# Patient Record
Sex: Male | Born: 1965 | Race: White | Hispanic: No | Marital: Married | Smoking: Never smoker
Health system: Southern US, Community
[De-identification: ages and names within clinical notes are randomized; demographics above are authoritative.]

## PROBLEM LIST (undated history)

## (undated) DIAGNOSIS — B029 Zoster without complications: Secondary | ICD-10-CM

## (undated) HISTORY — DX: Zoster without complications: B02.9

---

## 1989-08-11 HISTORY — PX: KNEE ARTHROSCOPY: SHX127

## 1993-08-11 DIAGNOSIS — B029 Zoster without complications: Secondary | ICD-10-CM

## 1993-08-11 HISTORY — DX: Zoster without complications: B02.9

## 2003-08-12 HISTORY — PX: CYST EXCISION: SHX5701

## 2012-02-01 ENCOUNTER — Encounter (HOSPITAL_COMMUNITY): Payer: Self-pay | Admitting: Emergency Medicine

## 2012-02-01 ENCOUNTER — Emergency Department (HOSPITAL_COMMUNITY)
Admission: EM | Admit: 2012-02-01 | Discharge: 2012-02-01 | Disposition: A | Discharge status: Home / Self Care | Payer: BC Managed Care – PPO | Attending: Emergency Medicine | Admitting: Emergency Medicine

## 2012-02-01 DIAGNOSIS — S61019A Laceration without foreign body of unspecified thumb without damage to nail, initial encounter: Secondary | ICD-10-CM

## 2012-02-01 DIAGNOSIS — W261XXA Contact with sword or dagger, initial encounter: Secondary | ICD-10-CM | POA: Insufficient documentation

## 2012-02-01 DIAGNOSIS — S61209A Unspecified open wound of unspecified finger without damage to nail, initial encounter: Secondary | ICD-10-CM | POA: Insufficient documentation

## 2012-02-01 DIAGNOSIS — W260XXA Contact with knife, initial encounter: Secondary | ICD-10-CM | POA: Insufficient documentation

## 2012-02-01 MED ORDER — CEPHALEXIN 250 MG PO CAPS
500.0000 mg | ORAL_CAPSULE | Freq: Once | ORAL | Status: AC
  Administered 2012-02-01: 500 mg via ORAL
  Filled 2012-02-01: qty 2

## 2012-02-01 MED ORDER — IBUPROFEN 800 MG PO TABS: 800.0000 mg | ORAL_TABLET | Freq: Three times a day (TID) | ORAL | Status: AC | PRN

## 2012-02-01 MED ORDER — TETANUS-DIPHTH-ACELL PERTUSSIS 5-2.5-18.5 LF-MCG/0.5 IM SUSP
0.5000 mL | Freq: Once | INTRAMUSCULAR | Status: AC
  Administered 2012-02-01: 0.5 mL via INTRAMUSCULAR
  Filled 2012-02-01: qty 0.5

## 2012-02-01 MED ORDER — CEPHALEXIN 500 MG PO CAPS: 500.0000 mg | ORAL_CAPSULE | Freq: Four times a day (QID) | ORAL | Status: AC

## 2012-02-01 NOTE — ED Provider Notes (Signed)
History     CSN: [id number]  Arrival date & time 02/01/12  1927   First MD Initiated Contact with Patient 02/01/12 1945      Chief Complaint  Patient presents with  . Extremity Laceration    (Consider location/radiation/quality/duration/timing/severity/associated sxs/prior treatment) HPI Comments: Patient presents with a 2cm laceration to his left distal thumb. He cut his thumb with a clean kitchen knife this evening and immediately applied pressure after the bleeding started. The patient has full range of motion of his thumb and is in no pain. The patient has no neuro deficits. There were no foreign bodies present in the laceration and the cut was 2 cm deep. The patient does not know the date of his last tetanus shot.  Patient is a 46 y.o. male presenting with skin laceration. The history is provided by the patient.  Laceration  The incident occurred 3 to 5 hours ago. The laceration is located on the left hand. The laceration is 2 cm in size. The laceration mechanism was a a clean knife. The patient is experiencing no pain. He reports no foreign bodies present. His tetanus status is unknown.    History reviewed. No pertinent past medical history.  History reviewed. No pertinent past surgical history.  No family history on file.  History  Substance Use Topics  . Smoking status: Never Smoker   . Smokeless tobacco: Not on file  . Alcohol Use: No      Review of Systems  Constitutional: Negative for activity change.  Musculoskeletal: Negative for joint swelling and arthralgias.  Skin: Positive for wound.  Neurological: Negative for weakness and numbness.    Allergies  Review of patient's allergies indicates no known allergies.  Home Medications  No current outpatient prescriptions on file.  BP 160/96  Temp 98 F (36.7 C) (Oral)  Resp 18  SpO2 96%  Physical Exam  Nursing note and vitals reviewed. Constitutional: He appears well-developed and well-nourished.    HENT:  Head: Normocephalic and atraumatic.  Eyes: Conjunctivae are normal.  Neck: Normal range of motion. Neck supple.  Cardiovascular: Normal pulses.   Pulses:      Radial pulses are 2+ on the right side, and 2+ on the left side.  Musculoskeletal: He exhibits tenderness. He exhibits no edema.       Hands: Neurological: He is alert. No sensory deficit.       Motor, sensation, and vascular distal to the injury is fully intact.   Skin: Skin is warm and dry.  Psychiatric: He has a normal mood and affect.    ED Course  Procedures (including critical care time)  Labs Reviewed - No data to display No results found.   1. Thumb laceration     Patient seen and examined.  Vital signs reviewed and are as follows: Filed Vitals:   02/01/12 1930  BP: 160/96  Temp: 98 F (36.7 C)  Resp: 18   Digital block performed Technique: ring block  Finger: L thumb Area prepped with alcohol wipe and iodine Medication: 5mL of 2% lidocaine without epinephrine Patient tolerated procedure well. Moderate anesthesia achieved. Patient needed direct injection of <42mL lidocaine around wound to complete anesthesia.   LACERATION REPAIR Performed by: Carolee Rota Authorized by: Carolee Rota Consent: Verbal consent obtained. Risks and benefits: risks, benefits and alternatives were discussed Consent given by: patient Patient identity confirmed: provided demographic data Prepped and Draped in normal sterile fashion Wound explored  Laceration Location: L thumb distal to IP joint, ulnar aspect  Laceration Length: 2cm  No Foreign Bodies seen or palpated  Anesthesia: ring block as above  Irrigation method: syringe with splash guard  Amount of cleaning: copious  Skin closure: Ethilon 4-0  Number of sutures: 3  Technique: simple interrupted  Patient tolerance: Patient tolerated the procedure well with no immediate complications.  Keflex and tetanus booster given prior to discharge.    The patient was urged to return to the Emergency Department urgently with worsening pain, swelling, expanding erythema especially if it streaks away from the affected area, fever, or if they have any other concerns. Patient verbalized understanding. Urged ortho hand follow-up if he wishes to have specialist follow-up.   Urged return in 10 days for wound recheck and suture removal.   Patient verbalizes understanding and agrees with plan.    MDM  Do not suspect FB or bony involvement -- abx rx given proximity of laceration to bone after close examination. Fingertip has good vascular supply. No neuro deficit.         Renne Crigler, [place] 02/05/12 [phone]

## 2012-02-01 NOTE — ED Notes (Signed)
Pt has approx 2 cm laceration to L thumb and nail just pta while cooking dinner.  Unknown last DT. Still bleeding on arrival.

## 2012-02-01 NOTE — Discharge Instructions (Signed)
Please read and follow all provided instructions.  Your diagnoses today include:  1. Thumb laceration     Tests performed today include:  Vital signs. See below for your results today.   Medications prescribed:   Ibuprofen - anti-inflammatory pain medication  Do not exceed 800mg  ibuprofen every 8 hours  You have been prescribed an anti-inflammatory medication or NSAID. Take with food. Take smallest effective dose for the shortest duration needed for your pain. Stop taking if you experience stomach pain or vomiting.    Keflex - antibiotic that kills skin bacteria  You have been prescribed an antibiotic medicine: take the entire course of medicine even if you are feeling better. Stopping early can cause the antibiotic not to work.  Take any prescribed medications only as directed.  Home care instructions:   Follow any educational materials contained in this packet.  Wash wound gently with warm soap and water at least twice a day.  Follow-up instructions: Please follow-up with the orthopedic hand physician referral listed in 7-10 days for further evaluation of your symptoms. If you do not have a primary care doctor -- see below for referral information.   Return instructions:   Please return to the Emergency Department if you experience worsening symptoms.   Return with worsening pain, swelling, redness of the finger or if you have trouble moving finger  Please return if you have any other emergent concerns.  Additional Information:  Your vital signs today were: BP 160/96  Temp 98 F (36.7 C) (Oral)  Resp 18  SpO2 96% If your blood pressure (BP) was elevated above 135/85 this visit, please have this repeated by your doctor within one month. -------------- No Primary Care Doctor Call [facility]  [phone] Other agencies that provide inexpensive medical care    [facility]  [phone]    [facility]  [phone]    [facility]  [phone]    [facility]  [phone]    [facility]  [phone]    [facility]  [phone] -------------- RESOURCE GUIDE:  Dental Problems  Patients with Medicaid: [facility] [phone]                                            [phone] [address] Phone:  [phone]                                                   Phone:  [phone]  If unable to pay or uninsured, contact:  [facility] or [facility]. to become qualified for the adult dental clinic.  Chronic Pain Problems Contact [facility]  [phone] Patients need to be referred by their primary care doctor.  Insufficient Money for Medicine Contact [employer] Way:  call "211" or [facility] [phone].  Psychological Services [facility]  [phone] [facility]  [phone] [facility] Mental [facility]   [phone] (emergency services [phone])  Substance Abuse Resources [facility]  [phone] [facility] [phone] [facility] [place] [phone]  [facility] [phone] Residential & Outpatient Substance Abuse Program  [phone]  Abuse/Neglect [facility] Child Abuse Hotline 432-724-3043 [facility] Child Abuse Hotline [phone]) 080-5075 (After Hours)  Emergency Shelter [facility] [phone]  Maternity Homes [facility] [place] [facility] [place] [phone] [facility] [phone]  [facility]  [facility] [place]     [employer]                          [facility] Dept. [address] [place]                       [address]      [address]  Blondell Reveal Phone:  [phone]                                   Phone:  [phone]                 Phone:   [phone]  [facility] Phone:  [phone]  [facility] Child Abuse Hotline [phone] (After Hours)

## 2012-02-06 NOTE — ED Provider Notes (Signed)
Medical screening examination/treatment/procedure(s) were performed by non-physician practitioner and as supervising physician I was immediately available for consultation/collaboration.   Joya Gaskins, MD 02/06/12 [phone]

## 2016-03-25 ENCOUNTER — Ambulatory Visit: Payer: BC Managed Care – PPO | Admitting: Family Medicine

## 2016-03-28 ENCOUNTER — Encounter: Payer: Self-pay | Admitting: Family Medicine

## 2016-03-28 ENCOUNTER — Ambulatory Visit (INDEPENDENT_AMBULATORY_CARE_PROVIDER_SITE_OTHER): Payer: BC Managed Care – PPO | Admitting: Family Medicine

## 2016-03-28 VITALS — BP 112/64 | HR 79 | Temp 97.9°F | Ht 78.25 in | Wt 324.1 lb

## 2016-03-28 DIAGNOSIS — Z8349 Family history of other endocrine, nutritional and metabolic diseases: Secondary | ICD-10-CM

## 2016-03-28 DIAGNOSIS — R2241 Localized swelling, mass and lump, right lower limb: Secondary | ICD-10-CM

## 2016-03-28 DIAGNOSIS — Z0001 Encounter for general adult medical examination with abnormal findings: Secondary | ICD-10-CM

## 2016-03-28 DIAGNOSIS — Z0000 Encounter for general adult medical examination without abnormal findings: Secondary | ICD-10-CM

## 2016-03-28 LAB — BASIC METABOLIC PANEL
BUN: 14 mg/dL (ref 6–23)
CHLORIDE: 103 meq/L (ref 96–112)
CO2: 30 meq/L (ref 19–32)
CREATININE: 1.11 mg/dL (ref 0.40–1.50)
Calcium: 9.8 mg/dL (ref 8.4–10.5)
GFR: 74.95 mL/min (ref >60.00)
Glucose, Bld: 77 mg/dL (ref 70–99)
Potassium: 4.7 mEq/L (ref 3.5–5.1)
Sodium: 141 mEq/L (ref 135–145)

## 2016-03-28 LAB — CBC WITH DIFFERENTIAL/PLATELET
BASOS ABS: 0 10*3/uL (ref 0.0–0.1)
Basophils Relative: 0.6 % (ref 0.0–3.0)
Eosinophils Absolute: 0.1 10*3/uL (ref 0.0–0.7)
Eosinophils Relative: 1.7 % (ref 0.0–5.0)
HEMATOCRIT: 44.8 % (ref 39.0–52.0)
Hemoglobin: 15.4 g/dL (ref 13.0–17.0)
LYMPHS PCT: 24.3 % (ref 12.0–46.0)
Lymphs Abs: 1.8 10*3/uL (ref 0.7–4.0)
MCHC: 34.3 g/dL (ref 30.0–36.0)
MCV: 87.5 fl (ref 78.0–100.0)
MONOS PCT: 10 % (ref 3.0–12.0)
Monocytes Absolute: 0.8 10*3/uL (ref 0.1–1.0)
Neutro Abs: 4.8 10*3/uL (ref 1.4–7.7)
Neutrophils Relative %: 63.4 % (ref 43.0–77.0)
Platelets: 261 10*3/uL (ref 150.0–400.0)
RBC: 5.12 Mil/uL (ref 4.22–5.81)
RDW: 13.4 % (ref 11.5–15.5)
WBC: 7.5 10*3/uL (ref 4.0–10.5)

## 2016-03-28 LAB — LDL CHOLESTEROL, DIRECT: Direct LDL: 133 mg/dL

## 2016-03-28 LAB — LIPID PANEL
CHOL/HDL RATIO: 5
CHOLESTEROL: 203 mg/dL — AB (ref 0–200)
HDL: 37.9 mg/dL — ABNORMAL LOW (ref >39.00)
NONHDL: 165.13
Triglycerides: 273 mg/dL — ABNORMAL HIGH (ref 0.0–149.0)
VLDL: 54.6 mg/dL — ABNORMAL HIGH (ref 0.0–40.0)

## 2016-03-28 LAB — HEPATIC FUNCTION PANEL
ALK PHOS: 71 U/L (ref 39–117)
ALT: 34 U/L (ref 0–53)
AST: 20 U/L (ref 0–37)
Albumin: 4.2 g/dL (ref 3.5–5.2)
BILIRUBIN DIRECT: 0.1 mg/dL (ref 0.0–0.3)
TOTAL PROTEIN: 6.4 g/dL (ref 6.0–8.3)
Total Bilirubin: 0.5 mg/dL (ref 0.2–1.2)

## 2016-03-28 LAB — TSH: TSH: 2.12 u[IU]/mL (ref 0.35–4.50)

## 2016-03-28 LAB — PSA: PSA: 0.7 ng/mL (ref 0.10–4.00)

## 2016-03-28 NOTE — Patient Instructions (Signed)
We will call you with labs done today We will set up general surgery referral for right thigh lesion Try to lose some weight

## 2016-03-28 NOTE — Progress Notes (Signed)
Pre visit review using our clinic review tool, if applicable. No additional management support is needed unless otherwise documented below in the visit note. 

## 2016-03-28 NOTE — Progress Notes (Signed)
Subjective:     Patient ID: Tyler Fernandez, male   DOB: 1966/04/10, 50 y.o.   MRN: [id number]  HPI Patient is seen to establish care and requesting complete physical. He has unremarkable past medical history. Previously followed at [facility]. Last physical was February 2016. Never smoked. Rare alcohol use. No prior surgeries. Takes no medications.  Married and works as a Hydrologistconstruction instructor at [employer] Family history significant for father with history of hemachromatosis. His father had prostate cancer and died of another type of cancer which he thinks was pancreatic. Brother died age 49 possibly of pulmonary embolus following prolonged travel. No family history of colon cancer.  Patient has right posterior thigh lesion which is subcutaneous and has been present for several years but he thinks recently growing. Slightly sore to touch. Very firm to palpation. Denies any appetite or weight changes. No regional adenopathy. Prior history of cyst excision on the back which apparently was a sebaceous cyst. His wife states that current right thigh lesion is very similar in consistency. Denies prior history of thigh trauma  He is considering vasectomy. Would like names of local urologists. Last tetanus 2013  Past Medical History:  Diagnosis Date  . Shingles 1995   Past Surgical History:  Procedure Laterality Date  . CYST EXCISION  2005   benign lesion from back  . KNEE ARTHROSCOPY Right 1991    reports that he has never smoked. He does not have any smokeless tobacco history on file. He reports that he does not drink alcohol or use drugs. family history includes Other in his father; Prostate cancer in his father. Allergies  Allergen Reactions  . Aluminum-Containing Compounds Rash     Review of Systems  Constitutional: Negative for activity change, appetite change, fatigue and fever.  HENT: Negative for congestion, ear pain and trouble swallowing.   Eyes: Negative  for pain and visual disturbance.  Respiratory: Negative for cough, shortness of breath and wheezing.   Cardiovascular: Negative for chest pain and palpitations.  Gastrointestinal: Negative for abdominal distention, abdominal pain, blood in stool, constipation, diarrhea, nausea, rectal pain and vomiting.  Genitourinary: Negative for dysuria, hematuria and testicular pain.  Musculoskeletal: Negative for arthralgias and joint swelling.  Skin: Negative for rash.  Neurological: Negative for dizziness, syncope and headaches.  Hematological: Negative for adenopathy.  Psychiatric/Behavioral: Negative for confusion and dysphoric mood.       Objective:   Physical Exam  Constitutional: He is oriented to person, place, and time. He appears well-developed and well-nourished. No distress.  HENT:  Head: Normocephalic and atraumatic.  Right Ear: External ear normal.  Left Ear: External ear normal.  Mouth/Throat: Oropharynx is clear and moist.  Eyes: Conjunctivae and EOM are normal. Pupils are equal, round, and reactive to light.  Neck: Normal range of motion. Neck supple. No thyromegaly present.  Cardiovascular: Normal rate, regular rhythm and normal heart sounds.   No murmur heard. Pulmonary/Chest: No respiratory distress. He has no wheezes. He has no rales.  Abdominal: Soft. Bowel sounds are normal. He exhibits no distension and no mass. There is no tenderness. There is no rebound and no guarding.  Musculoskeletal: He exhibits no edema.  Right posterior thigh approximately 3 cm firm minimally mobile nontender hard mass  Lymphadenopathy:    He has no cervical adenopathy.  Neurological: He is alert and oriented to person, place, and time. He displays normal reflexes. No cranial nerve deficit.  Skin: No rash noted.  Psychiatric: He has a normal mood  and affect.       Assessment:     #1 physical exam. Will need colonoscopy at age 50. Tetanus up-to-date. We discussed PSA screening given his  family history of prostate cancer and he agrees  #2 right posterior thigh mass. This would be atypical for lipoma given consistency and also is not typical of most sebaceous cysts. He states this is slowly growing in size  #3 positive family history of hemochromatosis. Patient apparently has not been screened previously    Plan:     -Obtain screening lab work and include PSA -Screening colonoscopy by age 50 -Set up general surgery referral regarding slowly growing right subcutaneous posterior thigh mass -Assess hemochromatosis DNA PCR test -He is encouraged to lose some weight and establish more consistent exercise  Kristian CoveyBruce W Yarrow Linhart MD [facility]

## 2016-04-03 LAB — HEMOCHROMATOSIS DNA-PCR(C282Y,H63D)

## 2016-04-16 ENCOUNTER — Telehealth: Payer: Self-pay

## 2016-04-16 NOTE — Telephone Encounter (Signed)
Called patient.  No answer.

## 2016-04-16 NOTE — Telephone Encounter (Signed)
-----   Message from Kristian CoveyBruce W Burchette, MD sent at 04/03/2016 12:12 PM EDT ----- This pt is heterozygous for hereditary hemochromatosis.  Most heterozygous individuals do not have clinical manifestations of hemochromatosis. I recommend follow up to discuss and will also get further labs that day with transferrin saturation.  No urgency but would try to get back in 2-4 weeks.

## 2016-04-18 ENCOUNTER — Encounter: Payer: Self-pay | Admitting: Family Medicine

## 2017-07-29 ENCOUNTER — Telehealth: Payer: Self-pay | Admitting: Family Medicine

## 2017-07-29 DIAGNOSIS — G4730 Sleep apnea, unspecified: Secondary | ICD-10-CM

## 2017-07-29 NOTE — Telephone Encounter (Signed)
Referral placed.

## 2017-07-29 NOTE — Telephone Encounter (Signed)
Copied from CRM [phone]. Topic: Referral - Request >> Jul 29, 2017 10:45 AM Tyler Fernandez, Tyler Fernandez wrote: Reason for CRM: pt is requesting a referral to pulmonary. Pt says that he would like to have a sleep study completed because his wife made him aware that sometimes at night while sleeping he stops breathing. Pt would like to have referral w/out an apt w/PCP if possible.    Please assist further.     CB: [phone]

## 2017-07-29 NOTE — Telephone Encounter (Signed)
Ok to refer.

## 2017-09-14 ENCOUNTER — Institutional Professional Consult (permissible substitution): Payer: BC Managed Care – PPO | Admitting: Pulmonary Disease

## 2017-09-23 ENCOUNTER — Encounter: Payer: Self-pay | Admitting: Pulmonary Disease

## 2017-09-23 ENCOUNTER — Ambulatory Visit: Payer: BC Managed Care – PPO | Admitting: Pulmonary Disease

## 2017-09-23 VITALS — BP 120/80 | HR 84 | Ht 79.0 in | Wt 325.8 lb

## 2017-09-23 DIAGNOSIS — R29818 Other symptoms and signs involving the nervous system: Secondary | ICD-10-CM

## 2017-09-23 DIAGNOSIS — R058 Other specified cough: Secondary | ICD-10-CM

## 2017-09-23 DIAGNOSIS — R05 Cough: Secondary | ICD-10-CM | POA: Diagnosis not present

## 2017-09-23 NOTE — Progress Notes (Signed)
[facility]  Chief Complaint  Patient presents with  . sleep consult    Pt referred by [facility]. Pt snores loudly, gasps for air, wakes himself up in middle of night snoring.     Vital signs: BP 120/80 (BP Location: Left Arm, Cuff Size: Normal)   Pulse 84   Ht 6\' 7"  (2.007 m)   Wt (!) 325 lb 12.8 oz (147.8 kg)   SpO2 97%   BMI 36.70 kg/m   History of Present Illness: Tyler Fernandez is a 52 y.o. male for evaluation of sleep problems.  His wife has been concerned about his snoring.  This has been getting worse.  He has trouble sleeping on his back, and his wife says he stops breathing while asleep.  He has noticed feeling more tired during the day.  He goes to sleep at 10 pm.  He falls asleep after 15 minutes.  He wakes up some times to use the bathroom.  He is a restless sleeper otherwise.  He gets out of bed at 630 am.  He feels okay in the morning.  He denies morning headache.  He does not use anything to help him fall sleep or stay awake.  He denies sleep walking, sleep talking, bruxism, or nightmares.  There is no history of restless legs.  He denies sleep hallucinations, sleep paralysis, or cataplexy.  The Epworth score is 0 out of 24.    Physical Exam:  General - pleasant Eyes - pupils reactive ENT - no sinus tenderness, no oral exudate, no LAN, boggy nasal mucosa, MP 3 Cardiac - regular, no murmur Chest - no wheeze, rales Abd - soft, non tender Ext - no edema Skin - no rashes Neuro - normal strength Psych - normal mood  Discussion: He has snoring, sleep disruption, apnea, and daytime sleepiness.  I am concerned he could have sleep apnea.  We discussed how sleep apnea can affect various health problems, including risks for hypertension, cardiovascular disease, and diabetes.  We also discussed how sleep disruption can increase risks for accidents, such as while driving.  Weight loss as a means of improving sleep apnea  was also reviewed.  Additional treatment options discussed were CPAP therapy, oral appliance, and surgical intervention.  He also reports sinus congestion, post nasal drip, and cough.  Assessment/Plan:  Suspected sleep apnea. - will arrange for home sleep study  Upper airway cough syndrome. - will have him try nasal irrigation and nasal steroids   Patient Instructions  Try using nasal irrigation for your sinuses - a brand you could try is NeilsMed Sinus Rinse Try using flonase or nasacort one spray in each nostril daily Will arrange for home sleep study Will call to arrange for follow up after sleep study reviewed     Coralyn Helling, MD [facility] Pulmonary/Critical Care 09/23/2017, 3:22 PM Pager:  [phone]  Flow Sheet  Sleep tests:  Review of Systems:  Constitutional: Negative for fever and unexpected weight change.  HENT: Positive for congestion, dental problem, ear pain, postnasal drip, sinus pressure and sneezing. Negative for nosebleeds, rhinorrhea, sore throat and trouble swallowing.   Eyes: Negative for redness and itching.  Respiratory: Positive for wheezing. Negative for cough, chest tightness and shortness of breath.   Cardiovascular: Positive for leg swelling. Negative for palpitations.  Gastrointestinal: Negative for nausea and vomiting.  Genitourinary: Negative for dysuria.  Musculoskeletal: Positive for joint swelling.  Skin: Negative for rash.  Allergic/Immunologic: Positive for environmental allergies. Negative  for food allergies and immunocompromised state.  Neurological: Positive for headaches.  Hematological: Does not bruise/bleed easily.  Psychiatric/Behavioral: Negative for dysphoric mood. The patient is not nervous/anxious.    Past Medical History: He  has a past medical history of Shingles (1995).  Past Surgical History: He  has a past surgical history that includes Knee arthroscopy (Right, 1991) and Cyst excision (2005).  Family History: His  family history includes Aneurysm in his brother, father, and mother; Hemochromatosis in his father; Other in his father; Prostate cancer in his father; Pulmonary embolism in his brother and father.  Social History: He  reports that  has never smoked. he has never used smokeless tobacco. He reports that he does not drink alcohol or use drugs.  Medications: Allergies as of 09/23/2017      Reactions   Aluminum-containing Compounds Rash      Medication List        Accurate as of 09/23/17  3:22 PM. Always use your most recent med list.          ANTACID MEDICINE PO Take by mouth as needed.

## 2017-09-23 NOTE — Progress Notes (Signed)
   Subjective:    Patient ID: Claretta FraiseJames Mark Ciocca, male    DOB: 1966/03/08, 52 y.o.   MRN: [id number]  HPI    Review of Systems  Constitutional: Negative for fever and unexpected weight change.  HENT: Positive for congestion, dental problem, ear pain, postnasal drip, sinus pressure and sneezing. Negative for nosebleeds, rhinorrhea, sore throat and trouble swallowing.   Eyes: Negative for redness and itching.  Respiratory: Positive for wheezing. Negative for cough, chest tightness and shortness of breath.   Cardiovascular: Positive for leg swelling. Negative for palpitations.  Gastrointestinal: Negative for nausea and vomiting.  Genitourinary: Negative for dysuria.  Musculoskeletal: Positive for joint swelling.  Skin: Negative for rash.  Allergic/Immunologic: Positive for environmental allergies. Negative for food allergies and immunocompromised state.  Neurological: Positive for headaches.  Hematological: Does not bruise/bleed easily.  Psychiatric/Behavioral: Negative for dysphoric mood. The patient is not nervous/anxious.        Objective:   Physical Exam        Assessment & Plan:

## 2017-09-23 NOTE — Patient Instructions (Signed)
Try using nasal irrigation for your sinuses - a brand you could try is NeilsMed Sinus Rinse Try using flonase or nasacort one spray in each nostril daily Will arrange for home sleep study Will call to arrange for follow up after sleep study reviewed

## 2017-09-29 ENCOUNTER — Telehealth: Payer: Self-pay | Admitting: Pulmonary Disease

## 2017-09-29 DIAGNOSIS — R29818 Other symptoms and signs involving the nervous system: Secondary | ICD-10-CM

## 2017-09-29 NOTE — Telephone Encounter (Signed)
Pt was denied by insurance for HST per VS; in need of split night Placed order for split night today, [facility] Nothing further needed

## 2017-10-02 ENCOUNTER — Ambulatory Visit (HOSPITAL_BASED_OUTPATIENT_CLINIC_OR_DEPARTMENT_OTHER): Payer: BC Managed Care – PPO | Attending: Pulmonary Disease

## 2017-10-16 ENCOUNTER — Ambulatory Visit: Payer: BC Managed Care – PPO | Admitting: Family Medicine

## 2018-03-16 ENCOUNTER — Encounter: Payer: BC Managed Care – PPO | Admitting: Family Medicine

## 2018-03-16 DIAGNOSIS — Z0289 Encounter for other administrative examinations: Secondary | ICD-10-CM

## 2018-06-11 HISTORY — PX: MULTIPLE TOOTH EXTRACTIONS: SHX2053

## 2018-06-18 ENCOUNTER — Ambulatory Visit: Payer: BC Managed Care – PPO | Admitting: Family Medicine

## 2018-06-18 ENCOUNTER — Encounter: Payer: Self-pay | Admitting: Family Medicine

## 2018-06-18 VITALS — BP 126/80 | HR 74 | Temp 97.6°F | Wt 313.0 lb

## 2018-06-18 DIAGNOSIS — K047 Periapical abscess without sinus: Secondary | ICD-10-CM

## 2018-06-18 DIAGNOSIS — K0889 Other specified disorders of teeth and supporting structures: Secondary | ICD-10-CM

## 2018-06-18 MED ORDER — AMOXICILLIN-POT CLAVULANATE 500-125 MG PO TABS: 1.0000 | ORAL_TABLET | Freq: Two times a day (BID) | ORAL | 0 refills | Status: AC

## 2018-06-18 MED ORDER — MELOXICAM 7.5 MG PO TABS
7.5000 mg | ORAL_TABLET | Freq: Every day | ORAL | 0 refills | Status: DC
Start: 2018-06-18 — End: 2018-07-26

## 2018-06-18 NOTE — Patient Instructions (Signed)
Dental Abscess A dental abscess is a collection of pus in or around a tooth. What are the causes? This condition is caused by a bacterial infection around the root of the tooth that involves the inner part of the tooth (pulp). It may result from:  Severe tooth decay.  Trauma to the tooth that allows bacteria to enter into the pulp, such as a broken or chipped tooth.  Severe gum disease around a tooth.  What are the signs or symptoms? Symptoms of this condition include:  Severe pain in and around the infected tooth.  Swelling and redness around the infected tooth, in the mouth, or in the face.  Tenderness.  Pus drainage.  Bad breath.  Bitter taste in the mouth.  Difficulty swallowing.  Difficulty opening the mouth.  Nausea.  Vomiting.  Chills.  Swollen neck glands.  Fever.  How is this diagnosed? This condition is diagnosed with examination of the infected tooth. During the exam, your dentist may tap on the infected tooth. Your dentist will also ask about your medical and dental history and may order X-rays. How is this treated? This condition is treated by eliminating the infection. This may be done with:  Antibiotic medicine.  A root canal. This may be performed to save the tooth.  Pulling (extracting) the tooth. This may also involve draining the abscess. This is done if the tooth cannot be saved.  Follow these instructions at home:  Take medicines only as directed by your dentist.  If you were prescribed antibiotic medicine, finish all of it even if you start to feel better.  Rinse your mouth (gargle) often with salt water to relieve pain or swelling.  Do not drive or operate heavy machinery while taking pain medicine.  Do not apply heat to the outside of your mouth.  Keep all follow-up visits as directed by your dentist. This is important. Contact a health care provider if:  Your pain is worse and is not helped by medicine. Get help right away  if:  You have a fever or chills.  Your symptoms suddenly get worse.  You have a very bad headache.  You have problems breathing or swallowing.  You have trouble opening your mouth.  You have swelling in your neck or around your eye. This information is not intended to replace advice given to you by your health care provider. Make sure you discuss any questions you have with your health care provider. Document Released: 07/28/2005 Document Revised: 12/06/2015 Document Reviewed: 07/25/2014 Elsevier Interactive Patient Education  2018 [employer]  

## 2018-06-18 NOTE — Progress Notes (Signed)
Subjective:    Patient ID: Tyler Fernandez, male    DOB: [birth year], 52 y.o.   MRN: [id number]  No chief complaint on file.   HPI Patient was seen today for acute concern.  Pt endorses toothache x several days.  Pt notes facial swelling and pain.  States feels like his jaw/face is "in a vice grip" making it difficult to sleep.  Pt has an appt with his dentist on Monday.  Denies fever, chills, N/V.  Pt has tried Naproxen and ASA for his pain.  He does not want any narcotic pain meds.    Of note, pt endorses recent abx use for a sinus infection 3 wks ago, seen by UC, unsure of med given.  Per chart review given amoxicillin.  At that time CC was tooth pain.  Past Medical History:  Diagnosis Date  . Shingles 1995    Allergies  Allergen Reactions  . Aluminum-Containing Compounds Rash    ROS General: Denies fever, chills, night sweats, changes in weight, changes in appetite HEENT: Denies headaches, ear pain, changes in vision, rhinorrhea, sore throat  +facial/tooth pain and swelling CV: Denies CP, palpitations, SOB, orthopnea Pulm: Denies SOB, cough, wheezing GI: Denies abdominal pain, nausea, vomiting, diarrhea, constipation GU: Denies dysuria, hematuria, frequency, vaginal discharge Msk: Denies muscle cramps, joint pains Neuro: Denies weakness, numbness, tingling Skin: Denies rashes, bruising Psych: Denies depression, anxiety, hallucinations   Objective:    Blood pressure 126/80, pulse 74, temperature 97.6 F (36.4 C), temperature source Oral, weight (!) 313 lb (142 kg), SpO2 98 %.  Gen. Pleasant, well-nourished, in no distress, normal affect   HEENT: [place]/AT, R mandible with visible edema, Poor dentition, carries, Cracked tooth #29.  TTP of R mandible.  no scleral icterus, PERRLA, EOMI, nares patent without drainage, pharynx without erythema or exudate. Lungs: no accessory muscle use Cardiovascular: RRR, no peripheral edema Neuro:  A&Ox3, CN II-XII intact, normal gait  Wt  Readings from Last 3 Encounters:  06/18/18 (!) 313 lb (142 kg)  09/23/17 (!) 325 lb 12.8 oz (147.8 kg)  03/28/16 (!) 324 lb 1.6 oz (147 kg)    Lab Results  Component Value Date   WBC 7.5 03/28/2016   HGB 15.4 03/28/2016   HCT 44.8 03/28/2016   PLT 261.0 03/28/2016   GLUCOSE 77 03/28/2016   CHOL 203 (H) 03/28/2016   TRIG 273.0 (H) 03/28/2016   HDL 37.90 (L) 03/28/2016   LDLDIRECT 133.0 03/28/2016   ALT 34 03/28/2016   AST 20 03/28/2016   NA 141 03/28/2016   K 4.7 03/28/2016   CL 103 03/28/2016   CREATININE 1.11 03/28/2016   BUN 14 03/28/2016   CO2 30 03/28/2016   TSH 2.12 03/28/2016   PSA 0.70 03/28/2016    Assessment/Plan:  Dental abscess  -advised to f/u with Dentist  - Plan: amoxicillin-clavulanate (AUGMENTIN) 500-125 MG tablet  Tooth pain  -consider oral gel or Ambesol to apply to gum - Plan: meloxicam (MOBIC) 7.5 MG tablet  F/u prn  Abbe Amsterdam, MD

## 2018-07-26 ENCOUNTER — Other Ambulatory Visit: Payer: Self-pay

## 2018-07-26 ENCOUNTER — Encounter: Payer: Self-pay | Admitting: Family Medicine

## 2018-07-26 ENCOUNTER — Ambulatory Visit (INDEPENDENT_AMBULATORY_CARE_PROVIDER_SITE_OTHER): Payer: BC Managed Care – PPO | Admitting: Family Medicine

## 2018-07-26 VITALS — BP 124/80 | HR 80 | Temp 97.9°F | Ht 76.25 in | Wt 309.6 lb

## 2018-07-26 DIAGNOSIS — Z0000 Encounter for general adult medical examination without abnormal findings: Secondary | ICD-10-CM | POA: Diagnosis not present

## 2018-07-26 DIAGNOSIS — Z8349 Family history of other endocrine, nutritional and metabolic diseases: Secondary | ICD-10-CM | POA: Diagnosis not present

## 2018-07-26 LAB — CBC WITH DIFFERENTIAL/PLATELET
BASOS PCT: 0.9 % (ref 0.0–3.0)
Basophils Absolute: 0.1 10*3/uL (ref 0.0–0.1)
EOS PCT: 3.1 % (ref 0.0–5.0)
Eosinophils Absolute: 0.2 10*3/uL (ref 0.0–0.7)
HCT: 45.8 % (ref 39.0–52.0)
HEMOGLOBIN: 15.8 g/dL (ref 13.0–17.0)
LYMPHS ABS: 1.6 10*3/uL (ref 0.7–4.0)
Lymphocytes Relative: 24.1 % (ref 12.0–46.0)
MCHC: 34.5 g/dL (ref 30.0–36.0)
MCV: 87.8 fl (ref 78.0–100.0)
Monocytes Absolute: 0.4 10*3/uL (ref 0.1–1.0)
Monocytes Relative: 6.3 % (ref 3.0–12.0)
NEUTROS ABS: 4.3 10*3/uL (ref 1.4–7.7)
Neutrophils Relative %: 65.6 % (ref 43.0–77.0)
PLATELETS: 265 10*3/uL (ref 150.0–400.0)
RBC: 5.22 Mil/uL (ref 4.22–5.81)
RDW: 13.9 % (ref 11.5–15.5)
WBC: 6.5 10*3/uL (ref 4.0–10.5)

## 2018-07-26 LAB — BASIC METABOLIC PANEL
BUN: 19 mg/dL (ref 6–23)
CHLORIDE: 103 meq/L (ref 96–112)
CO2: 29 mEq/L (ref 19–32)
Calcium: 9.6 mg/dL (ref 8.4–10.5)
Creatinine, Ser: 1.13 mg/dL (ref 0.40–1.50)
GFR: 72.73 mL/min (ref >60.00)
Glucose, Bld: 96 mg/dL (ref 70–99)
POTASSIUM: 4.9 meq/L (ref 3.5–5.1)
SODIUM: 139 meq/L (ref 135–145)

## 2018-07-26 LAB — LIPID PANEL
CHOLESTEROL: 193 mg/dL (ref 0–200)
HDL: 37.1 mg/dL — AB (ref >39.00)
LDL CALC: 123 mg/dL — AB (ref 0–99)
NonHDL: 155.79
TRIGLYCERIDES: 163 mg/dL — AB (ref 0.0–149.0)
Total CHOL/HDL Ratio: 5
VLDL: 32.6 mg/dL (ref 0.0–40.0)

## 2018-07-26 LAB — HEPATIC FUNCTION PANEL
ALBUMIN: 4.4 g/dL (ref 3.5–5.2)
ALT: 29 U/L (ref 0–53)
AST: 16 U/L (ref 0–37)
Alkaline Phosphatase: 71 U/L (ref 39–117)
Bilirubin, Direct: 0.1 mg/dL (ref 0.0–0.3)
Total Bilirubin: 0.7 mg/dL (ref 0.2–1.2)
Total Protein: 6.5 g/dL (ref 6.0–8.3)

## 2018-07-26 LAB — TSH: TSH: 1.79 u[IU]/mL (ref 0.35–4.50)

## 2018-07-26 LAB — PSA: PSA: 0.76 ng/mL (ref 0.10–4.00)

## 2018-07-26 NOTE — Addendum Note (Signed)
Addended by: Conrad BurlingtonAUSTIN, Yissel Habermehl on: 07/26/2018 10:26 AM   Modules accepted: Orders

## 2018-07-26 NOTE — Progress Notes (Signed)
Subjective:     Patient ID: Tyler Fernandez, male   DOB: 1966/08/04, 52 y.o.   MRN: [id number]  HPI Patient seen for physical exam.  Generally very healthy.  He has had some sporadic elevated blood pressure range recently at dentist and also the [facility].  He had blood pressure reading here though of 126/80 back in November.  No headaches.  He has been exercising at the [facility] most daily and is lost some weight this year.  He does sometimes drink 6-8 beers per day but none over the past few days.  He is trying to scale back.  Never had colonoscopy.  Just turned 51 last year.  Takes no regular medications.  Non-smoker.  Tetanus up-to-date.  Declines shingles vaccine.  Has a family history hemochromatosis in father.  Patient had genetic screen couple years ago and was heterozygous  Past Medical History:  Diagnosis Date  . Shingles 1995   Past Surgical History:  Procedure Laterality Date  . CYST EXCISION  2005   benign lesion from back  . KNEE ARTHROSCOPY Right 1991  . MULTIPLE TOOTH EXTRACTIONS  06/2018    reports that he has never smoked. He has never used smokeless tobacco. He reports that he does not drink alcohol or use drugs. family history includes Aneurysm in his brother, father, and mother; Hemochromatosis in his father; Other in his father; Prostate cancer in his father; Pulmonary embolism in his brother and father. Allergies  Allergen Reactions  . Aluminum-Containing Compounds Rash     Review of Systems  Constitutional: Negative for activity change, appetite change, fatigue and fever.  HENT: Negative for congestion, ear pain and trouble swallowing.   Eyes: Negative for pain and visual disturbance.  Respiratory: Negative for cough, shortness of breath and wheezing.   Cardiovascular: Negative for chest pain and palpitations.  Gastrointestinal: Negative for abdominal distention, abdominal pain, blood in stool, constipation, diarrhea, nausea, rectal pain and vomiting.   Genitourinary: Negative for dysuria, hematuria and testicular pain.  Musculoskeletal: Negative for arthralgias and joint swelling.  Skin: Negative for rash.  Neurological: Negative for dizziness, syncope and headaches.  Hematological: Negative for adenopathy.  Psychiatric/Behavioral: Negative for confusion and dysphoric mood.       Objective:   Physical Exam Constitutional:      General: He is not in acute distress.    Appearance: He is well-developed.  HENT:     Head: Normocephalic and atraumatic.     Right Ear: External ear normal.     Left Ear: External ear normal.  Eyes:     Conjunctiva/sclera: Conjunctivae normal.     Pupils: Pupils are equal, round, and reactive to light.  Neck:     Musculoskeletal: Normal range of motion and neck supple.     Thyroid: No thyromegaly.  Cardiovascular:     Rate and Rhythm: Normal rate and regular rhythm.     Heart sounds: Normal heart sounds. No murmur.  Pulmonary:     Effort: No respiratory distress.     Breath sounds: No wheezing or rales.  Abdominal:     General: Bowel sounds are normal. There is no distension.     Palpations: Abdomen is soft. There is no mass.     Tenderness: There is no abdominal tenderness. There is no guarding or rebound.  Lymphadenopathy:     Cervical: No cervical adenopathy.  Skin:    Findings: No rash.  Neurological:     Mental Status: He is alert and oriented to person, place,  and time.     Cranial Nerves: No cranial nerve deficit.     Deep Tendon Reflexes: Reflexes normal.        Assessment:     Physical exam.  Positive family history of hemochromatosis.  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  Will include serum ferritin, TIBC, serum iron -He is encouraged to continue weight loss efforts and regular exercise -Recommend he scale back beer to no more than 2/day -Monitor blood pressure and  be in touch if consistently greater than 140/90 -Strongly advocated colonoscopy but he declines at this point.  He states he wishes to think this over -Discussed shingles vaccine and he declines at this time  Tyler CoveyBruce W Makel Mcmann MD [facility]

## 2018-07-26 NOTE — Patient Instructions (Signed)
Keep exercising and losing weight  Monitor blood pressure and be in touch if consistently > 140/90.    Keep alcohol intake to less than 2 beers per day.

## 2018-07-26 NOTE — Addendum Note (Signed)
Addended by: Conrad BurlingtonAUSTIN, Preslyn Warr on: 07/26/2018 10:25 AM   Modules accepted: Orders

## 2018-07-27 LAB — IRON,TIBC AND FERRITIN PANEL
%SAT: 30 % (calc) (ref 20–48)
Ferritin: 289 ng/mL (ref 38–380)
Iron: 93 ug/dL (ref 50–180)
TIBC: 312 mcg/dL (calc) (ref 250–425)

## 2020-02-27 ENCOUNTER — Encounter: Payer: Self-pay | Admitting: Family Medicine

## 2020-02-27 ENCOUNTER — Telehealth (INDEPENDENT_AMBULATORY_CARE_PROVIDER_SITE_OTHER): Payer: BC Managed Care – PPO | Admitting: Family Medicine

## 2020-02-27 VITALS — Ht 78.0 in | Wt 310.0 lb

## 2020-02-27 DIAGNOSIS — J0100 Acute maxillary sinusitis, unspecified: Secondary | ICD-10-CM

## 2020-02-27 MED ORDER — AMOXICILLIN-POT CLAVULANATE 875-125 MG PO TABS: 1.0000 | ORAL_TABLET | Freq: Two times a day (BID) | ORAL | 0 refills | Status: DC

## 2020-02-27 NOTE — Progress Notes (Signed)
Patient ID: Tyler Fernandez, male   DOB: 1965/12/28, 54 y.o.   MRN: [id number]  This visit type was conducted due to national recommendations for restrictions regarding the COVID-19 pandemic in an effort to limit this patient's exposure and mitigate transmission in our community.   Virtual Visit via Video Note  I connected with Tyler Fernandez on 02/27/20 at  2:45 PM EDT by a video enabled telemedicine application and verified that I am speaking with the correct person using two identifiers.  Location patient: home Location provider:work or home office Persons participating in the virtual visit: patient, provider  I discussed the limitations of evaluation and management by telemedicine and the availability of in person appointments. The patient expressed understanding and agreed to proceed.   HPI: Tyler Fernandez called with onset over a week ago of some left sided facial pain.  He has had some left earache past couple of days.  He has pain mostly over the left maxillary region.  Is had some upper teeth pain.  No bloody discharge.  Some nasal congestion.  Increased malaise.  No fever.  No cough.  No sick contacts.  He has had similar symptoms with sinusitis in the past.   ROS: See pertinent positives and negatives per HPI.  Past Medical History:  Diagnosis Date  . Shingles 1995    Past Surgical History:  Procedure Laterality Date  . CYST EXCISION  2005   benign lesion from back  . KNEE ARTHROSCOPY Right 1991  . MULTIPLE TOOTH EXTRACTIONS  06/2018    Family History  Problem Relation Age of Onset  . Prostate cancer Father   . Other Father        tumor of abdomen (environmental causes)  . Aneurysm Father   . Hemochromatosis Father   . Pulmonary embolism Father   . Aneurysm Mother   . Aneurysm Brother   . Pulmonary embolism Brother     SOCIAL HX: Non-smoker   Current Outpatient Medications:  .  amoxicillin-clavulanate (AUGMENTIN) 875-125 MG tablet, Take 1 tablet by mouth 2 (two) times  daily., Disp: 20 tablet, Rfl: 0  EXAM:  VITALS per patient if applicable:  GENERAL: alert, oriented, appears well and in no acute distress  HEENT: atraumatic, conjunttiva clear, no obvious abnormalities on inspection of external nose and ears  NECK: normal movements of the head and neck  LUNGS: on inspection no signs of respiratory distress, breathing rate appears normal, no obvious gross SOB, gasping or wheezing  CV: no obvious cyanosis  MS: moves all visible extremities without noticeable abnormality  PSYCH/NEURO: pleasant and cooperative, no obvious depression or anxiety, speech and thought processing grossly intact  ASSESSMENT AND PLAN:  Discussed the following assessment and plan:  Probable acute left maxillary sinusitis  -Start Augmentin 875 mg twice daily with food -Plenty of fluids -Warm compresses several times daily -Touch base if not improving over the next week     I discussed the assessment and treatment plan with the patient. The patient was provided an opportunity to ask questions and all were answered. The patient agreed with the plan and demonstrated an understanding of the instructions.   The patient was advised to call back or seek an in-person evaluation if the symptoms worsen or if the condition fails to improve as anticipated.     Evelena Peat, MD

## 2020-03-27 ENCOUNTER — Encounter: Payer: BC Managed Care – PPO | Admitting: Family Medicine

## 2020-04-06 ENCOUNTER — Other Ambulatory Visit: Payer: Self-pay

## 2020-04-06 ENCOUNTER — Ambulatory Visit (INDEPENDENT_AMBULATORY_CARE_PROVIDER_SITE_OTHER): Payer: BC Managed Care – PPO | Admitting: Family Medicine

## 2020-04-06 ENCOUNTER — Encounter: Payer: Self-pay | Admitting: Family Medicine

## 2020-04-06 VITALS — BP 124/78 | HR 85 | Temp 98.1°F | Ht 78.0 in | Wt 319.0 lb

## 2020-04-06 DIAGNOSIS — Z0000 Encounter for general adult medical examination without abnormal findings: Secondary | ICD-10-CM

## 2020-04-06 MED ORDER — MOMETASONE FUROATE 0.1 % EX SOLN: Freq: Every day | CUTANEOUS | 1 refills | Status: AC

## 2020-04-06 NOTE — Progress Notes (Signed)
Established Patient Office Visit  Subjective:  Patient ID: Tyler Fernandez, male    DOB: 06-Dec-1965  Age: 54 y.o. MRN: [id number]  CC:  Chief Complaint  Patient presents with  . Annual Exam    Doing okay    HPI Tyler Fernandez presents for physical exam.  He takes no regular medications.  He has no specific complaints at this time.  He relates he had a brother that died age 92 of MI.  There is no other family history of CAD.  His father died of some type of cancer and he thinks this is pancreatic but is not sure.  Tyler Fernandez is never smoked.  Married with 2 teenage children ages 53 and 68.  Health maintenance reviewed.  He is never had colon cancer screening and declines colonoscopy.  He is considering possible Cologuard.  Tetanus is up-to-date.  He declines hepatitis C screening.  He has had Covid vaccine.  He declines flu vaccines.  Past Medical History:  Diagnosis Date  . Shingles 1995    Past Surgical History:  Procedure Laterality Date  . CYST EXCISION  2005   benign lesion from back  . KNEE ARTHROSCOPY Right 1991  . MULTIPLE TOOTH EXTRACTIONS  06/2018    Family History  Problem Relation Age of Onset  . Prostate cancer Father   . Other Father        tumor of abdomen (environmental causes)  . Aneurysm Father   . Hemochromatosis Father   . Pulmonary embolism Father   . Aneurysm Mother   . Aneurysm Brother   . Pulmonary embolism Brother     Social History   Socioeconomic History  . Marital status: Married    Spouse name: Not on file  . Number of children: Not on file  . Years of education: Not on file  . Highest education level: Not on file  Occupational History  . Not on file  Tobacco Use  . Smoking status: Never Smoker  . Smokeless tobacco: Never Used  Vaping Use  . Vaping Use: Never used  Substance and Sexual Activity  . Alcohol use: No  . Drug use: No  . Sexual activity: Not on file  Other Topics Concern  . Not on file  Social History  Narrative  . Not on file   Social Determinants of Health   Financial Resource Strain:   . Difficulty of Paying Living Expenses: Not on file  Food Insecurity:   . Worried About Programme researcher, broadcasting/film/video in the Last Year: Not on file  . Ran Out of Food in the Last Year: Not on file  Transportation Needs:   . Lack of Transportation (Medical): Not on file  . Lack of Transportation (Non-Medical): Not on file  Physical Activity:   . Days of Exercise per Week: Not on file  . Minutes of Exercise per Session: Not on file  Stress:   . Feeling of Stress : Not on file  Social Connections:   . Frequency of Communication with Friends and Family: Not on file  . Frequency of Social Gatherings with Friends and Family: Not on file  . Attends Religious Services: Not on file  . Active Member of Clubs or Organizations: Not on file  . Attends Banker Meetings: Not on file  . Marital Status: Not on file  Intimate Partner Violence:   . Fear of Current or Ex-Partner: Not on file  . Emotionally Abused: Not on file  . Physically Abused:  Not on file  . Sexually Abused: Not on file    Outpatient Medications Prior to Visit  Medication Sig Dispense Refill  . amoxicillin-clavulanate (AUGMENTIN) 875-125 MG tablet Take 1 tablet by mouth 2 (two) times daily. (Patient not taking: Reported on 04/06/2020) 20 tablet 0   No facility-administered medications prior to visit.    Allergies  Allergen Reactions  . Aluminum-Containing Compounds Rash    ROS Review of Systems  Constitutional: Negative for activity change, appetite change, fatigue and fever.  HENT: Negative for congestion, ear pain and trouble swallowing.   Eyes: Negative for pain and visual disturbance.  Respiratory: Negative for cough, shortness of breath and wheezing.   Cardiovascular: Negative for chest pain and palpitations.  Gastrointestinal: Negative for abdominal distention, abdominal pain, blood in stool, constipation, diarrhea,  nausea, rectal pain and vomiting.  Genitourinary: Negative for dysuria, hematuria and testicular pain.  Musculoskeletal: Negative for arthralgias and joint swelling.  Skin: Negative for rash.  Neurological: Negative for dizziness, syncope and headaches.  Hematological: Negative for adenopathy.  Psychiatric/Behavioral: Negative for confusion and dysphoric mood.      Objective:    Physical Exam Constitutional:      General: He is not in acute distress.    Appearance: He is well-developed.  HENT:     Head: Normocephalic and atraumatic.     Comments: He has some very mild erythema both external canals with some mild scaling Eyes:     Conjunctiva/sclera: Conjunctivae normal.     Pupils: Pupils are equal, round, and reactive to light.  Neck:     Thyroid: No thyromegaly.  Cardiovascular:     Rate and Rhythm: Normal rate and regular rhythm.     Heart sounds: Normal heart sounds. No murmur heard.   Pulmonary:     Effort: No respiratory distress.     Breath sounds: No wheezing or rales.  Abdominal:     General: Bowel sounds are normal. There is no distension.     Palpations: Abdomen is soft. There is no mass.     Tenderness: There is no abdominal tenderness. There is no guarding or rebound.  Musculoskeletal:     Cervical back: Normal range of motion and neck supple.     Right lower leg: No edema.     Left lower leg: No edema.  Lymphadenopathy:     Cervical: No cervical adenopathy.  Skin:    Findings: No rash.  Neurological:     Mental Status: He is alert and oriented to person, place, and time.     Cranial Nerves: No cranial nerve deficit.     Deep Tendon Reflexes: Reflexes normal.     BP 124/78   Pulse 85   Temp 98.1 F (36.7 C) (Oral)   Ht 6\' 6"  (1.981 m)   Wt (!) 319 lb (144.7 kg)   SpO2 98%   BMI 36.86 kg/m  Wt Readings from Last 3 Encounters:  04/06/20 (!) 319 lb (144.7 kg)  02/27/20 (!) 310 lb (140.6 kg)  07/26/18 (!) 309 lb 9.6 oz (140.4 kg)     There  are no preventive care reminders to display for this patient.  There are no preventive care reminders to display for this patient.  Lab Results  Component Value Date   TSH 1.79 07/26/2018   Lab Results  Component Value Date   WBC 6.5 07/26/2018   HGB 15.8 07/26/2018   HCT 45.8 07/26/2018   MCV 87.8 07/26/2018   PLT 265.0 07/26/2018  Lab Results  Component Value Date   NA 139 07/26/2018   K 4.9 07/26/2018   CO2 29 07/26/2018   GLUCOSE 96 07/26/2018   BUN 19 07/26/2018   CREATININE 1.13 07/26/2018   BILITOT 0.7 07/26/2018   ALKPHOS 71 07/26/2018   AST 16 07/26/2018   ALT 29 07/26/2018   PROT 6.5 07/26/2018   ALBUMIN 4.4 07/26/2018   CALCIUM 9.6 07/26/2018   GFR 72.73 07/26/2018   Lab Results  Component Value Date   CHOL 193 07/26/2018   Lab Results  Component Value Date   HDL 37.10 (L) 07/26/2018   Lab Results  Component Value Date   LDLCALC 123 (H) 07/26/2018   Lab Results  Component Value Date   TRIG 163.0 (H) 07/26/2018   Lab Results  Component Value Date   CHOLHDL 5 07/26/2018   No results found for: HGBA1C    Assessment & Plan:   Problem List Items Addressed This Visit    None    Visit Diagnoses    Physical exam    -  Primary   Relevant Orders   Basic metabolic panel   Lipid panel   CBC with Differential/Platelet   TSH   Hepatic function panel   PSA    Generally healthy 54 year old male.  He has no chronic medical problems.  He does have some mild eczematous changes of both external canals and will prescribe Elocon lotion for that.  We discussed possible coronary calcium score given his family history of coronary disease and 61 year old brother.  He will consider.  He was given handout to read about that.  We have strongly advised colon cancer screening.  He declines colonoscopy.  He will consider Cologuard but wishes to check on insurance first  He declines flu vaccine  Meds ordered this encounter  Medications  . mometasone (ELOCON)  0.1 % lotion    Sig: Apply topically daily.    Dispense:  60 mL    Refill:  1    Follow-up: No follow-ups on file.    Evelena Peat, MD

## 2020-04-06 NOTE — Addendum Note (Signed)
Addended by: Lerry Liner on: 04/06/2020 07:34 AM   Modules accepted: Orders

## 2020-04-06 NOTE — Patient Instructions (Signed)
Coronary Calcium Scan A coronary calcium scan is an imaging test used to look for deposits of plaque in the inner lining of the blood vessels of the heart (coronary arteries). Plaque is made up of calcium, protein, and fatty substances. These deposits of plaque can partly clog and narrow the coronary arteries without producing any symptoms or warning signs. This puts a person at risk for a heart attack. This test is recommended for people who are at moderate risk for heart disease. The test can find plaque deposits before symptoms develop. Tell a health care provider about:  Any allergies you have.  All medicines you are taking, including vitamins, herbs, eye drops, creams, and over-the-counter medicines.  Any problems you or family members have had with anesthetic medicines.  Any blood disorders you have.  Any surgeries you have had.  Any medical conditions you have.  Whether you are pregnant or may be pregnant. What are the risks? Generally, this is a safe procedure. However, problems may occur, including:  Harm to a pregnant woman and her unborn baby. This test involves the use of radiation. Radiation exposure can be dangerous to a pregnant woman and her unborn baby. If you are pregnant or think you may be pregnant, you should not have this procedure done.  Slight increase in the risk of cancer. This is because of the radiation involved in the test. What happens before the procedure? Ask your health care provider for any specific instructions on how to prepare for this procedure. You may be asked to avoid products that contain caffeine, tobacco, or nicotine for 4 hours before the procedure. What happens during the procedure?   You will undress and remove any jewelry from your neck or chest.  You will put on a hospital gown.  Sticky electrodes will be placed on your chest. The electrodes will be connected to an electrocardiogram (ECG) machine to record a tracing of the electrical  activity of your heart.  You will lie down on a curved bed that is attached to the [facility].  You may be given medicine to slow down your heart rate so that clear pictures can be created.  You will be moved into the CT scanner, and the CT scanner will take pictures of your heart. During this time, you will be asked to lie still and hold your breath for 2-3 seconds at a time while each picture of your heart is being taken. The procedure may vary among health care providers and hospitals. What happens after the procedure?  You can get dressed.  You can return to your normal activities.  It is up to you to get the results of your procedure. Ask your health care provider, or the department that is doing the procedure, when your results will be ready. Summary  A coronary calcium scan is an imaging test used to look for deposits of plaque in the inner lining of the blood vessels of the heart (coronary arteries). Plaque is made up of calcium, protein, and fatty substances.  Generally, this is a safe procedure. Tell your health care provider if you are pregnant or may be pregnant.  Ask your health care provider for any specific instructions on how to prepare for this procedure.  A CT scanner will take pictures of your heart.  You can return to your normal activities after the scan is done. This information is not intended to replace advice given to you by your health care provider. Make sure you discuss any  questions you have with your health care provider. Document Revised: 02/15/2019 Document Reviewed: 02/15/2019 Elsevier Patient Education  2020 Elsevier Inc. Preventive Care 49-20 Years Old, Male Preventive care refers to lifestyle choices and visits with your health care provider that can promote health and wellness. This includes:  A yearly physical exam. This is also called an annual well check.  Regular dental and eye exams.  Immunizations.  Screening for certain  conditions.  Healthy lifestyle choices, such as eating a healthy diet, getting regular exercise, not using drugs or products that contain nicotine and tobacco, and limiting alcohol use. What can I expect for my preventive care visit? Physical exam Your health care provider will check:  Height and weight. These may be used to calculate body mass index (BMI), which is a measurement that tells if you are at a healthy weight.  Heart rate and blood pressure.  Your skin for abnormal spots. Counseling Your health care provider may ask you questions about:  Alcohol, tobacco, and drug use.  Emotional well-being.  Home and relationship well-being.  Sexual activity.  Eating habits.  Work and work Statistician. What immunizations do I need?  Influenza (flu) vaccine  This is recommended every year. Tetanus, diphtheria, and pertussis (Tdap) vaccine  You may need a Td booster every 10 years. Varicella (chickenpox) vaccine  You may need this vaccine if you have not already been vaccinated. Zoster (shingles) vaccine  You may need this after age 28. Measles, mumps, and rubella (MMR) vaccine  You may need at least one dose of MMR if you were born in 1957 or later. You may also need a second dose. Pneumococcal conjugate (PCV13) vaccine  You may need this if you have certain conditions and were not previously vaccinated. Pneumococcal polysaccharide (PPSV23) vaccine  You may need one or two doses if you smoke cigarettes or if you have certain conditions. Meningococcal conjugate (MenACWY) vaccine  You may need this if you have certain conditions. Hepatitis A vaccine  You may need this if you have certain conditions or if you travel or work in places where you may be exposed to hepatitis A. Hepatitis B vaccine  You may need this if you have certain conditions or if you travel or work in places where you may be exposed to hepatitis B. Haemophilus influenzae type b (Hib) vaccine  You  may need this if you have certain risk factors. Human papillomavirus (HPV) vaccine  If recommended by your health care provider, you may need three doses over 6 months. You may receive vaccines as individual doses or as more than one vaccine together in one shot (combination vaccines). Talk with your health care provider about the risks and benefits of combination vaccines. What tests do I need? Blood tests  Lipid and cholesterol levels. These may be checked every 5 years, or more frequently if you are over 5 years old.  Hepatitis C test.  Hepatitis B test. Screening  Lung cancer screening. You may have this screening every year starting at age 63 if you have a 30-pack-year history of smoking and currently smoke or have quit within the past 15 years.  Prostate cancer screening. Recommendations will vary depending on your family history and other risks.  Colorectal cancer screening. All adults should have this screening starting at age 43 and continuing until age 105. Your health care provider may recommend screening at age 6 if you are at increased risk. You will have tests every 1-10 years, depending on your results and  the type of screening test.  Diabetes screening. This is done by checking your blood sugar (glucose) after you have not eaten for a while (fasting). You may have this done every 1-3 years.  Sexually transmitted disease (STD) testing. Follow these instructions at home: Eating and drinking  Eat a diet that includes fresh fruits and vegetables, whole grains, lean protein, and low-fat dairy products.  Take vitamin and mineral supplements as recommended by your health care provider.  Do not drink alcohol if your health care provider tells you not to drink.  If you drink alcohol: ? Limit how much you have to 0-2 drinks a day. ? Be aware of how much alcohol is in your drink. In the [place] one drink equals one 12 oz bottle of beer (355 mL), one 5 oz glass of wine (148 mL),  or one 1 oz glass of hard liquor (44 mL). Lifestyle  Take daily care of your teeth and gums.  Stay active. Exercise for at least 30 minutes on 5 or more days each week.  Do not use any products that contain nicotine or tobacco, such as cigarettes, e-cigarettes, and chewing tobacco. If you need help quitting, ask your health care provider.  If you are sexually active, practice safe sex. Use a condom or other form of protection to prevent STIs (sexually transmitted infections).  Talk with your health care provider about taking a low-dose aspirin every day starting at age 35. What's next?  Go to your health care provider once a year for a well check visit.  Ask your health care provider how often you should have your eyes and teeth checked.  Stay up to date on all vaccines. This information is not intended to replace advice given to you by your health care provider. Make sure you discuss any questions you have with your health care provider. Document Revised: 07/22/2018 Document Reviewed: 07/22/2018 Elsevier Patient Education  [place].  Consider Cologuard and let me know if interested  Let me know if you decide to get coronary calcium CT score.

## 2020-04-07 LAB — HEPATIC FUNCTION PANEL
AG Ratio: 2.1 (calc) (ref 1.0–2.5)
ALT: 69 U/L — ABNORMAL HIGH (ref 9–46)
AST: 35 U/L (ref 10–35)
Albumin: 4.5 g/dL (ref 3.6–5.1)
Alkaline phosphatase (APISO): 71 U/L (ref 35–144)
Bilirubin, Direct: 0.2 mg/dL (ref 0.0–0.2)
Globulin: 2.1 g/dL (calc) (ref 1.9–3.7)
Indirect Bilirubin: 0.7 mg/dL (calc) (ref 0.2–1.2)
Total Bilirubin: 0.9 mg/dL (ref 0.2–1.2)
Total Protein: 6.6 g/dL (ref 6.1–8.1)

## 2020-04-07 LAB — BASIC METABOLIC PANEL
BUN: 14 mg/dL (ref 7–25)
CO2: 27 mmol/L (ref 20–32)
Calcium: 9.5 mg/dL (ref 8.6–10.3)
Chloride: 103 mmol/L (ref 98–110)
Creat: 1.12 mg/dL (ref 0.70–1.33)
Glucose, Bld: 95 mg/dL (ref 65–99)
Potassium: 4.6 mmol/L (ref 3.5–5.3)
Sodium: 139 mmol/L (ref 135–146)

## 2020-04-07 LAB — LIPID PANEL
Cholesterol: 240 mg/dL — ABNORMAL HIGH (ref <200)
HDL: 48 mg/dL (ref >=40)
LDL Cholesterol (Calc): 164 mg/dL (calc) — ABNORMAL HIGH
Non-HDL Cholesterol (Calc): 192 mg/dL (calc) — ABNORMAL HIGH (ref <130)
Total CHOL/HDL Ratio: 5 (calc) — ABNORMAL HIGH (ref <5.0)
Triglycerides: 139 mg/dL (ref <150)

## 2020-04-07 LAB — CBC WITH DIFFERENTIAL/PLATELET
Absolute Monocytes: 439 cells/uL (ref 200–950)
Basophils Absolute: 51 cells/uL (ref 0–200)
Basophils Relative: 0.9 %
Eosinophils Absolute: 268 cells/uL (ref 15–500)
Eosinophils Relative: 4.7 %
HCT: 49 % (ref 38.5–50.0)
Hemoglobin: 16.5 g/dL (ref 13.2–17.1)
Lymphs Abs: 1442 cells/uL (ref 850–3900)
MCH: 30.3 pg (ref 27.0–33.0)
MCHC: 33.7 g/dL (ref 32.0–36.0)
MCV: 89.9 fL (ref 80.0–100.0)
MPV: 10.4 fL (ref 7.5–12.5)
Monocytes Relative: 7.7 %
Neutro Abs: 3500 cells/uL (ref 1500–7800)
Neutrophils Relative %: 61.4 %
Platelets: 220 10*3/uL (ref 140–400)
RBC: 5.45 10*6/uL (ref 4.20–5.80)
RDW: 12.8 % (ref 11.0–15.0)
Total Lymphocyte: 25.3 %
WBC: 5.7 10*3/uL (ref 3.8–10.8)

## 2020-04-07 LAB — PSA: PSA: 0.6 ng/mL (ref <=4.0)

## 2020-04-07 LAB — TSH: TSH: 2.56 mIU/L (ref 0.40–4.50)

## 2020-04-09 ENCOUNTER — Telehealth: Payer: Self-pay | Admitting: Family Medicine

## 2020-04-09 ENCOUNTER — Other Ambulatory Visit: Payer: Self-pay

## 2020-04-09 MED ORDER — ATORVASTATIN CALCIUM 20 MG PO TABS: 20.0000 mg | ORAL_TABLET | Freq: Every day | ORAL | 3 refills | Status: DC

## 2020-04-09 NOTE — Telephone Encounter (Signed)
Pt was returning a call.  

## 2020-04-10 NOTE — Telephone Encounter (Signed)
Spoke to pt and gave lab results. No further action needed!

## 2020-04-12 ENCOUNTER — Telehealth: Payer: Self-pay | Admitting: Family Medicine

## 2020-04-12 NOTE — Telephone Encounter (Signed)
OK to schedule

## 2020-04-12 NOTE — Telephone Encounter (Signed)
Patient called to schedule new patient appointments for his children Caydin Yeatts MRN [id number] and for Estel Scholze MRN [id number].  He stated that he spoke with you about his children establishing care with you.  Is it okay to have scheduled his children?

## 2021-04-23 ENCOUNTER — Encounter: Payer: BC Managed Care – PPO | Admitting: Family Medicine

## 2021-05-17 ENCOUNTER — Ambulatory Visit (INDEPENDENT_AMBULATORY_CARE_PROVIDER_SITE_OTHER): Payer: BC Managed Care – PPO | Admitting: Family Medicine

## 2021-05-17 ENCOUNTER — Other Ambulatory Visit: Payer: Self-pay

## 2021-05-17 ENCOUNTER — Encounter: Payer: Self-pay | Admitting: Family Medicine

## 2021-05-17 VITALS — BP 138/80 | HR 82 | Temp 98.4°F | Ht 79.0 in | Wt 309.9 lb

## 2021-05-17 DIAGNOSIS — Z1211 Encounter for screening for malignant neoplasm of colon: Secondary | ICD-10-CM

## 2021-05-17 DIAGNOSIS — M25511 Pain in right shoulder: Secondary | ICD-10-CM | POA: Diagnosis not present

## 2021-05-17 DIAGNOSIS — Z0000 Encounter for general adult medical examination without abnormal findings: Secondary | ICD-10-CM

## 2021-05-17 LAB — CBC WITH DIFFERENTIAL/PLATELET
Basophils Absolute: 0 10*3/uL (ref 0.0–0.1)
Basophils Relative: 0.8 % (ref 0.0–3.0)
Eosinophils Absolute: 0.1 10*3/uL (ref 0.0–0.7)
Eosinophils Relative: 1.1 % (ref 0.0–5.0)
HCT: 47.3 % (ref 39.0–52.0)
Hemoglobin: 16.1 g/dL (ref 13.0–17.0)
Lymphocytes Relative: 26.3 % (ref 12.0–46.0)
Lymphs Abs: 1.5 10*3/uL (ref 0.7–4.0)
MCHC: 34.2 g/dL (ref 30.0–36.0)
MCV: 88.5 fl (ref 78.0–100.0)
Monocytes Absolute: 0.4 10*3/uL (ref 0.1–1.0)
Monocytes Relative: 7.4 % (ref 3.0–12.0)
Neutro Abs: 3.6 10*3/uL (ref 1.4–7.7)
Neutrophils Relative %: 64.4 % (ref 43.0–77.0)
Platelets: 245 10*3/uL (ref 150.0–400.0)
RBC: 5.34 Mil/uL (ref 4.22–5.81)
RDW: 13.1 % (ref 11.5–15.5)
WBC: 5.6 10*3/uL (ref 4.0–10.5)

## 2021-05-17 LAB — LIPID PANEL
Cholesterol: 227 mg/dL — ABNORMAL HIGH (ref 0–200)
HDL: 52.2 mg/dL (ref >39.00)
LDL Cholesterol: 150 mg/dL — ABNORMAL HIGH (ref 0–99)
NonHDL: 175.12
Total CHOL/HDL Ratio: 4
Triglycerides: 126 mg/dL (ref 0.0–149.0)
VLDL: 25.2 mg/dL (ref 0.0–40.0)

## 2021-05-17 LAB — BASIC METABOLIC PANEL
BUN: 14 mg/dL (ref 6–23)
CO2: 29 mEq/L (ref 19–32)
Calcium: 9.6 mg/dL (ref 8.4–10.5)
Chloride: 103 mEq/L (ref 96–112)
Creatinine, Ser: 1.13 mg/dL (ref 0.40–1.50)
GFR: 74.07 mL/min (ref >60.00)
Glucose, Bld: 99 mg/dL (ref 70–99)
Potassium: 4.6 mEq/L (ref 3.5–5.1)
Sodium: 139 mEq/L (ref 135–145)

## 2021-05-17 LAB — HEPATIC FUNCTION PANEL
ALT: 37 U/L (ref 0–53)
AST: 23 U/L (ref 0–37)
Albumin: 4.4 g/dL (ref 3.5–5.2)
Alkaline Phosphatase: 76 U/L (ref 39–117)
Bilirubin, Direct: 0.2 mg/dL (ref 0.0–0.3)
Total Bilirubin: 1 mg/dL (ref 0.2–1.2)
Total Protein: 6.7 g/dL (ref 6.0–8.3)

## 2021-05-17 LAB — TSH: TSH: 2.98 u[IU]/mL (ref 0.35–5.50)

## 2021-05-17 LAB — PSA: PSA: 1.07 ng/mL (ref 0.10–4.00)

## 2021-05-17 NOTE — Progress Notes (Signed)
Established Patient Office Visit  Subjective:  Patient ID: Tyler Fernandez, male    DOB: 02/18/1966  Age: 55 y.o. MRN: [id number]  CC:  Chief Complaint  Patient presents with   Annual Exam    HPI Tyler Fernandez presents for complete physical.  He has history of hyperlipidemia.  Last year we had recommended Lipitor but he never started this.  He was concerned about possible side effects.  Apparently, several members of his family have had intolerance with Lipitor.  His major complaint today is right shoulder pain for several months.  He thinks he may have injured this lifting hay bales.  He has nightly pain and is starting to get restricted range of motion.  Has not had any relief with over-the-counter medications.  No prior history of shoulder difficulties.  Health maintenance reviewed  -Declines flu vaccine -Tetanus due next year -No history of colonoscopy screening.  He declines colonoscopy.  He does agree to [employer]. Declines shingles vaccine  Family history reviewed.  No significant changes.  He did have a brother that died age 58 presumably of coronary disease.  Social history.  Married.  Non-smoker.  Does farming.  Daughter getting ready to graduate high school next year.  They plan to travel to [place] after her graduation.  Past Medical History:  Diagnosis Date   Shingles 1995    Past Surgical History:  Procedure Laterality Date   CYST EXCISION  2005   benign lesion from back   KNEE ARTHROSCOPY Right 1991   MULTIPLE TOOTH EXTRACTIONS  06/2018    Family History  Problem Relation Age of Onset   Aneurysm Mother    Prostate cancer Father    Other Father        tumor of abdomen (environmental causes)   Aneurysm Father    Hemochromatosis Father    Pulmonary embolism Father    Heart disease Brother    Aneurysm Brother    Pulmonary embolism Brother     Social History   Socioeconomic History   Marital status: Married    Spouse name: Not on file    Number of children: Not on file   Years of education: Not on file   Highest education level: Not on file  Occupational History   Not on file  Tobacco Use   Smoking status: Never   Smokeless tobacco: Never  Vaping Use   Vaping Use: Never used  Substance and Sexual Activity   Alcohol use: No   Drug use: No   Sexual activity: Not on file  Other Topics Concern   Not on file  Social History Narrative   Not on file   Social Determinants of Health   Financial Resource Strain: Not on file  Food Insecurity: Not on file  Transportation Needs: Not on file  Physical Activity: Not on file  Stress: Not on file  Social Connections: Not on file  Intimate Partner Violence: Not on file    Outpatient Medications Prior to Visit  Medication Sig Dispense Refill   mometasone (ELOCON) 0.1 % lotion Apply topically daily. 60 mL 1   amoxicillin-clavulanate (AUGMENTIN) 875-125 MG tablet Take 1 tablet by mouth 2 (two) times daily. 20 tablet 0   atorvastatin (LIPITOR) 20 MG tablet Take 1 tablet (20 mg total) by mouth daily. 90 tablet 3   No facility-administered medications prior to visit.    Allergies  Allergen Reactions   Aluminum-Containing Compounds Rash    ROS Review of Systems  Constitutional:  Negative for activity change, appetite change, fatigue and fever.  HENT:  Negative for congestion, ear pain and trouble swallowing.   Eyes:  Negative for pain and visual disturbance.  Respiratory:  Negative for cough, shortness of breath and wheezing.   Cardiovascular:  Negative for chest pain and palpitations.  Gastrointestinal:  Negative for abdominal distention, abdominal pain, blood in stool, constipation, diarrhea, nausea, rectal pain and vomiting.  Genitourinary:  Negative for dysuria, hematuria and testicular pain.  Musculoskeletal:  Negative for joint swelling.       Right shoulder pain progressive over several months.  See HPI.  Skin:  Negative for rash.  Neurological:  Negative for  dizziness, syncope and headaches.  Hematological:  Negative for adenopathy.  Psychiatric/Behavioral:  Negative for confusion and dysphoric mood.      Objective:    Physical Exam Constitutional:      General: He is not in acute distress.    Appearance: He is well-developed.  HENT:     Head: Normocephalic and atraumatic.     Right Ear: External ear normal.     Left Ear: External ear normal.  Eyes:     Conjunctiva/sclera: Conjunctivae normal.     Pupils: Pupils are equal, round, and reactive to light.  Neck:     Thyroid: No thyromegaly.  Cardiovascular:     Rate and Rhythm: Normal rate and regular rhythm.     Heart sounds: Normal heart sounds. No murmur heard. Pulmonary:     Effort: No respiratory distress.     Breath sounds: No wheezing or rales.  Abdominal:     General: Bowel sounds are normal. There is no distension.     Palpations: Abdomen is soft. There is no mass.     Tenderness: There is no abdominal tenderness. There is no guarding or rebound.  Musculoskeletal:     Cervical back: Normal range of motion and neck supple.     Comments: Right shoulder reveals somewhat restricted range of motion.  He has difficulty with full abduction and also restricted range of motion with internal rotation.  Lymphadenopathy:     Cervical: No cervical adenopathy.  Skin:    Findings: No rash.  Neurological:     Mental Status: He is alert and oriented to person, place, and time.     Cranial Nerves: No cranial nerve deficit.     Deep Tendon Reflexes: Reflexes normal.    BP 138/80 (BP Location: Left Arm, Patient Position: Sitting, Cuff Size: Normal)   Pulse 82   Temp 98.4 F (36.9 C) (Oral)   Ht 6\' 7"  (2.007 m)   Wt (!) 309 lb 14.4 oz (140.6 kg)   SpO2 98%   BMI 34.91 kg/m  Wt Readings from Last 3 Encounters:  05/17/21 (!) 309 lb 14.4 oz (140.6 kg)  04/06/20 (!) 319 lb (144.7 kg)  02/27/20 (!) 310 lb (140.6 kg)     Health Maintenance Due  Topic Date Due   Hepatitis C  Screening  Never done   COLONOSCOPY (Pts 45-47yrs Insurance coverage will need to be confirmed)  Never done   COVID-19 Vaccine (3 - Booster for Pfizer series) 08/24/2020    There are no preventive care reminders to display for this patient.  Lab Results  Component Value Date   TSH 2.56 04/06/2020   Lab Results  Component Value Date   WBC 5.7 04/06/2020   HGB 16.5 04/06/2020   HCT 49.0 04/06/2020   MCV 89.9 04/06/2020   PLT 220 04/06/2020  Lab Results  Component Value Date   NA 139 04/06/2020   K 4.6 04/06/2020   CO2 27 04/06/2020   GLUCOSE 95 04/06/2020   BUN 14 04/06/2020   CREATININE 1.12 04/06/2020   BILITOT 0.9 04/06/2020   ALKPHOS 71 07/26/2018   AST 35 04/06/2020   ALT 69 (H) 04/06/2020   PROT 6.6 04/06/2020   ALBUMIN 4.4 07/26/2018   CALCIUM 9.5 04/06/2020   GFR 72.73 07/26/2018   Lab Results  Component Value Date   CHOL 240 (H) 04/06/2020   Lab Results  Component Value Date   HDL 48 04/06/2020   Lab Results  Component Value Date   LDLCALC 164 (H) 04/06/2020   Lab Results  Component Value Date   TRIG 139 04/06/2020   Lab Results  Component Value Date   CHOLHDL 5.0 (H) 04/06/2020   No results found for: HGBA1C    Assessment & Plan:   Problem List Items Addressed This Visit   None Visit Diagnoses     Screening for colon cancer    -  Primary   Relevant Orders   Cologuard   Right shoulder pain, unspecified chronicity       Relevant Orders   Ambulatory referral to Sports Medicine   Physical exam       Relevant Orders   Basic metabolic panel   Lipid panel   CBC with Differential/Platelet   TSH   Hepatic function panel   PSA     -Cologuard ordered.  He declines colonoscopy. -He declines flu vaccine and Shingrix. -Recommend tetanus booster next year -We discussed lipid management.  He would like to get labs first.  If lipids remain elevated consider Crestor.  He has been reluctant to consider any statins in the past. -Set up sports  medicine referral regarding progressive right shoulder pain with concern for adhesive capsulitis risk.  No orders of the defined types were placed in this encounter.   Follow-up: No follow-ups on file.    Evelena Peat, MD

## 2021-05-20 ENCOUNTER — Encounter: Payer: Self-pay | Admitting: Family Medicine

## 2021-05-20 NOTE — Telephone Encounter (Signed)
Please advise 

## 2021-05-22 ENCOUNTER — Ambulatory Visit (INDEPENDENT_AMBULATORY_CARE_PROVIDER_SITE_OTHER): Payer: BC Managed Care – PPO | Admitting: Sports Medicine

## 2021-05-22 ENCOUNTER — Ambulatory Visit: Payer: Self-pay

## 2021-05-22 ENCOUNTER — Other Ambulatory Visit: Payer: Self-pay

## 2021-05-22 ENCOUNTER — Ambulatory Visit (INDEPENDENT_AMBULATORY_CARE_PROVIDER_SITE_OTHER): Payer: BC Managed Care – PPO

## 2021-05-22 VITALS — BP 118/84 | HR 103 | Ht 79.0 in | Wt 311.0 lb

## 2021-05-22 DIAGNOSIS — M7551 Bursitis of right shoulder: Secondary | ICD-10-CM | POA: Diagnosis not present

## 2021-05-22 DIAGNOSIS — G8929 Other chronic pain: Secondary | ICD-10-CM | POA: Diagnosis not present

## 2021-05-22 DIAGNOSIS — M7581 Other shoulder lesions, right shoulder: Secondary | ICD-10-CM

## 2021-05-22 DIAGNOSIS — M25511 Pain in right shoulder: Secondary | ICD-10-CM

## 2021-05-22 NOTE — Progress Notes (Addendum)
Aleen Sells D.[facility] [address] [place] [address] Phone: [phone]   Assessment and Plan:     1. Chronic right shoulder pain 2. Subacromial bursitis of right shoulder joint 3. Rotator cuff tendonitis, right -Chronic, unchanged, initial sports medicine visit - Likely subacromial bursitis with underlying rotator cuff tendinopathy's based on HPI, physical exam, unremarkable x-ray and ultrasound findings - Patient elected for CSI subacromial.  Tolerated well per procedure note below - Start HEP for rotator cuff.  Handout provided - Tylenol/NSAIDs as needed for pain control - X-ray obtained in clinic.  My interpretation: No acute fracture, joint space maintained, no significant cortical irregularities of glenohumeral joint.  Mild arthritic changes at [facility] joint - DG Shoulder Right; Future - [place] LIMITED JOINT SPACE STRUCTURES UP RIGHT(NO LINKED CHARGES); Future  Procedure: Subacromial Injection Side: Right  Risks explained and consent was given verbally. The site was cleaned with alcohol prep. A steroid injection was performed from posterior approach using 64mL of 1% lidocaine without epinephrine and 42mL of kenalog 40mg /ml. This was well tolerated and resulted in symptomatic relief.  Needle was removed, hemostasis achieved, and post injection instructions were explained.   Pt was advised to call or return to clinic if these symptoms worsen or fail to improve as anticipated.    Pertinent previous records reviewed include PCP note   Follow Up: In 4 weeks for reevaluation.  Would consider MRI versus formal PT at that time if no improvement or worsening of symptoms.   Subjective:   I, , am serving as a scribe for Dr. Debbe Odea  Chief Complaint: Right shoulder pain   HPI:   05/22/21 Patient is a 55 year old male presenting with R shoulder pain ongoing for several months. Patient locates pain to front and back of  shoulder and describes pain as stabbing in nature. Patient thinks he may have injured it lifting bales. Pain is worse at night and is starting to get restricted ROM.   Radiates: yes to bicep Mechanical symptoms: yes Numbness/tingling: no Weakness: no Aggravates: reaching back or raising arm, sleeping on R side  Treatments tried: aspirin, ice,    Relevant Historical Information: Hyperlipidemia  Additional pertinent review of systems negative.   Current Outpatient Medications:    mometasone (ELOCON) 0.1 % lotion, Apply topically daily., Disp: 60 mL, Rfl: 1   Objective:     Vitals:   05/22/21 1045  BP: 118/84  Pulse: (!) 103  SpO2: 97%  Weight: (!) 311 lb (141.1 kg)  Height: 6\' 7"  (2.007 m)      Body mass index is 35.04 kg/m.    Physical Exam:    Gen: Appears well, nad, nontoxic and pleasant Neuro:sensation intact, strength is 5/5 with df/pf/inv/ev, muscle tone wnl Skin: no suspicious lesion or defmority Psych: A&O, appropriate mood and affect  Right shoulder: no deformity, swelling or muscle wasting No scapular winging FF 90, abd 90, int 10, ext 60 TTP deltoid, biceps groove, coracoid NTTP over the [place], clavicle, ac, trapezius, cervical spine Positive neer, hawkings, empty can, subscap liftoff, speeds, obriens, crossarm Neg ant drawer, sulcus sign, apprehension Negative Spurling's test bilat FROM of neck   Sports Medicine: Musculoskeletal Ultrasound.   Exam:Right Complete Shoulder Exam.  Diagnosis:  Chronic right shoulder pain  Biceps tendon: Abnormal-   irregular hyper cavity at insertion point of pectoralis minor.  Not associated with TTP or weakness Subscapularis: Normal Supraspinatus: Abnormal-hypoechoic edema at insertion consistent with tendinopathy Infraspinatus/teres minor:  Abnormal-hyperechoic band within infraspinatus consistent with tendinopathy Glenohumeral joint (posterior): Normal Acromioclavicular joint: Normal Additional findings: None     Impression:  Supraspinatus tendinopathy 2.  Infraspinatus tendinopathy   Electronically signed by:  Aleen Sells D.[facility] 11:41 AM 05/22/21

## 2021-05-22 NOTE — Patient Instructions (Addendum)
Good to see you  Injection given today  Home exercises given Tylenol and ibuprofen as needed for pain  See [place] again in 4 weeks

## 2021-06-17 NOTE — Progress Notes (Signed)
    Aleen Sells D.[facility] [address] [place] [address] Phone: [phone]   Assessment and Plan:    1. Chronic right shoulder pain 2. Subacromial bursitis of right shoulder joint - Chronic with exacerbation, subsequent visit - Significantly improved ROM of right shoulder and right shoulder pain after subacromial CSI on 05/22/2021 - Encouraged to continue ROM exercises,, HEP strengthening exercises, and restart swimming gradually as tolerated - May use NSAIDs as needed for pain control   Pertinent previous records reviewed include none pertinent   Follow Up: As needed if no improvement or worsening of symptoms   Subjective:   I, Debbe Odea, am serving as a scribe for Dr. Richardean Sale  Chief Complaint: right shoulder pain follow up   HPI:   05/22/21 Patient is a 55 year old male presenting with R shoulder pain ongoing for several months. Patient locates pain to front and back of shoulder and describes pain as stabbing in nature. Patient thinks he may have injured it lifting bales. Pain is worse at night and is starting to get restricted ROM.    Radiates: yes to bicep Mechanical symptoms: yes Numbness/tingling: no Weakness: no Aggravates: reaching back or raising arm, sleeping on R side  Treatments tried: aspirin, ice,   06/18/21 Patient states that the shoulder is so much better the only problem he is having trouble with ROM (reaching back). 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   Relevant Historical Information: Hyperlipidemia  Additional pertinent review of systems negative.   Current Outpatient Medications:    mometasone (ELOCON) 0.1 % lotion, Apply topically daily., Disp: 60 mL, Rfl: 1   Objective:     Vitals:   06/18/21 0805  BP: 130/80  Pulse: 67  SpO2: 98%  Weight: (!) 307 lb (139.3 kg)  Height: 6\' 7"  (2.007 m)      Body mass index is 34.58 kg/m.    Physical Exam:    Gen:  Appears well, nad, nontoxic and pleasant Neuro:sensation intact, strength is 5/5 with df/pf/inv/ev, muscle tone wnl Skin: no suspicious lesion or defmority Psych: A&O, appropriate mood and affect  Right shoulder: no deformity, swelling or muscle wasting No scapular winging FF 150, abd 150, int 0, ext 80 NTTP over the [place] Walk, clavicle, ac, coracoid, biceps groove, humerus, deltoid, trapezius, cervical spine Neg neer, hawkings, empty can, subscap liftoff, speeds, obriens, crossarm Neg ant drawer, sulcus sign, apprehension Negative Spurling's test bilat FROM of neck    Electronically signed by:  D[facility] 8:43 AM 06/18/21

## 2021-06-18 ENCOUNTER — Ambulatory Visit (INDEPENDENT_AMBULATORY_CARE_PROVIDER_SITE_OTHER): Payer: BC Managed Care – PPO | Admitting: Sports Medicine

## 2021-06-18 ENCOUNTER — Other Ambulatory Visit: Payer: Self-pay

## 2021-06-18 VITALS — BP 130/80 | HR 67 | Ht 79.0 in | Wt 307.0 lb

## 2021-06-18 DIAGNOSIS — M7551 Bursitis of right shoulder: Secondary | ICD-10-CM

## 2021-06-18 DIAGNOSIS — M25511 Pain in right shoulder: Secondary | ICD-10-CM

## 2021-06-18 DIAGNOSIS — G8929 Other chronic pain: Secondary | ICD-10-CM | POA: Diagnosis not present

## 2021-06-18 NOTE — Patient Instructions (Addendum)
Good to see you  See me again as needed 

## 2022-01-28 ENCOUNTER — Telehealth: Payer: BC Managed Care – PPO | Admitting: Family Medicine

## 2022-01-28 DIAGNOSIS — L309 Dermatitis, unspecified: Secondary | ICD-10-CM

## 2022-01-28 MED ORDER — TRIAMCINOLONE ACETONIDE 0.5 % EX CREA: 1.0000 | TOPICAL_CREAM | Freq: Two times a day (BID) | CUTANEOUS | 0 refills | Status: AC

## 2022-01-28 NOTE — Progress Notes (Signed)

## 2022-02-25 ENCOUNTER — Telehealth: Payer: Self-pay | Admitting: Family Medicine

## 2022-02-25 NOTE — Telephone Encounter (Signed)
Pt asked if Dr. can take his son Tyler Fernandez age 56 yrs old as a new pt. If so can his son sched his New pt appt on the same day as pt phys which is 03/19/2022 at 8:15am.  Please advise.

## 2022-03-05 NOTE — Telephone Encounter (Signed)
Pt son Stephanie Acre has been sched as a New Pt.   FYI

## 2022-03-19 ENCOUNTER — Ambulatory Visit (INDEPENDENT_AMBULATORY_CARE_PROVIDER_SITE_OTHER): Payer: BC Managed Care – PPO | Admitting: Family Medicine

## 2022-03-19 ENCOUNTER — Encounter: Payer: Self-pay | Admitting: Family Medicine

## 2022-03-19 VITALS — BP 114/80 | HR 70 | Temp 98.1°F | Ht 78.35 in | Wt 297.7 lb

## 2022-03-19 DIAGNOSIS — Z8349 Family history of other endocrine, nutritional and metabolic diseases: Secondary | ICD-10-CM

## 2022-03-19 DIAGNOSIS — Z0000 Encounter for general adult medical examination without abnormal findings: Secondary | ICD-10-CM | POA: Diagnosis not present

## 2022-03-19 DIAGNOSIS — Z23 Encounter for immunization: Secondary | ICD-10-CM | POA: Diagnosis not present

## 2022-03-19 DIAGNOSIS — Z125 Encounter for screening for malignant neoplasm of prostate: Secondary | ICD-10-CM

## 2022-03-19 LAB — CBC WITH DIFFERENTIAL/PLATELET
Basophils Absolute: 0 10*3/uL (ref 0.0–0.1)
Basophils Relative: 0.8 % (ref 0.0–3.0)
Eosinophils Absolute: 0.1 10*3/uL (ref 0.0–0.7)
Eosinophils Relative: 2.7 % (ref 0.0–5.0)
HCT: 49 % (ref 39.0–52.0)
Hemoglobin: 17 g/dL (ref 13.0–17.0)
Lymphocytes Relative: 26.4 % (ref 12.0–46.0)
Lymphs Abs: 1.3 10*3/uL (ref 0.7–4.0)
MCHC: 34.6 g/dL (ref 30.0–36.0)
MCV: 87.1 fl (ref 78.0–100.0)
Monocytes Absolute: 0.6 10*3/uL (ref 0.1–1.0)
Monocytes Relative: 12.5 % — ABNORMAL HIGH (ref 3.0–12.0)
Neutro Abs: 2.8 10*3/uL (ref 1.4–7.7)
Neutrophils Relative %: 57.6 % (ref 43.0–77.0)
Platelets: 223 10*3/uL (ref 150.0–400.0)
RBC: 5.63 Mil/uL (ref 4.22–5.81)
RDW: 13.7 % (ref 11.5–15.5)
WBC: 4.8 10*3/uL (ref 4.0–10.5)

## 2022-03-19 LAB — LIPID PANEL
Cholesterol: 230 mg/dL — ABNORMAL HIGH (ref 0–200)
HDL: 43.1 mg/dL (ref >39.00)
LDL Cholesterol: 152 mg/dL — ABNORMAL HIGH (ref 0–99)
NonHDL: 186.91
Total CHOL/HDL Ratio: 5
Triglycerides: 174 mg/dL — ABNORMAL HIGH (ref 0.0–149.0)
VLDL: 34.8 mg/dL (ref 0.0–40.0)

## 2022-03-19 LAB — HEPATIC FUNCTION PANEL
ALT: 62 U/L — ABNORMAL HIGH (ref 0–53)
AST: 46 U/L — ABNORMAL HIGH (ref 0–37)
Albumin: 4.6 g/dL (ref 3.5–5.2)
Alkaline Phosphatase: 83 U/L (ref 39–117)
Bilirubin, Direct: 0.2 mg/dL (ref 0.0–0.3)
Total Bilirubin: 1.1 mg/dL (ref 0.2–1.2)
Total Protein: 7 g/dL (ref 6.0–8.3)

## 2022-03-19 LAB — BASIC METABOLIC PANEL
BUN: 17 mg/dL (ref 6–23)
CO2: 28 mEq/L (ref 19–32)
Calcium: 10 mg/dL (ref 8.4–10.5)
Chloride: 100 mEq/L (ref 96–112)
Creatinine, Ser: 1.14 mg/dL (ref 0.40–1.50)
GFR: 72.86 mL/min (ref >60.00)
Glucose, Bld: 91 mg/dL (ref 70–99)
Potassium: 5.1 mEq/L (ref 3.5–5.1)
Sodium: 143 mEq/L (ref 135–145)

## 2022-03-19 LAB — TSH: TSH: 3.24 u[IU]/mL (ref 0.35–5.50)

## 2022-03-19 LAB — PSA: PSA: 0.85 ng/mL (ref 0.10–4.00)

## 2022-03-19 NOTE — Progress Notes (Signed)
Established Patient Office Visit  Subjective   Patient ID: Tyler Fernandez, male    DOB: 1966/02/09  Age: 56 y.o. MRN: [id number]  Chief Complaint  Patient presents with   Annual Exam    HPI   Tyler Fernandez is seen for physical exam.  Generally healthy.  Takes no regular medications.  He has lost a little weight since last year.  Good appetite.  Does have history of mild hyperlipidemia but declined statin use last year.  Health maintenance reviewed  -Due for tetanus now -Cologuard ordered last year but he never completed.  He declines colonoscopy. -Declines shingles vaccine  Family history-Brother had CAD at age 27.  Otherwise no significant changes.  Does relate family history of hemochromatosis in his father.  Patient was last screened 4 years ago.  Social history-married with 1 son and 1 daughter.  Non-smoker.  No regular alcohol.  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  Past Medical History:  Diagnosis Date   Shingles 1995   Past Surgical History:  Procedure Laterality Date   CYST EXCISION  2005   benign lesion from back   KNEE ARTHROSCOPY Right 1991   MULTIPLE TOOTH EXTRACTIONS  06/2018    reports that he has never smoked. He has never used smokeless tobacco. He reports that he does not drink alcohol and does not use drugs. family history includes Aneurysm in his brother, father, and mother; Heart disease in his brother; Hemochromatosis in his father; Other in his father; Prostate cancer in his father; Pulmonary embolism in his brother and father. Allergies  Allergen Reactions   Aluminum-Containing Compounds Rash     Review of Systems  Constitutional:  Negative for chills, fever, malaise/fatigue and weight loss.  HENT:  Negative for hearing loss.   Eyes:  Negative for blurred vision and double vision.  Respiratory:  Negative for cough and shortness of breath.   Cardiovascular:  Negative for chest pain, palpitations and leg swelling.  Gastrointestinal:  Negative  for abdominal pain, blood in stool, constipation and diarrhea.  Genitourinary:  Negative for dysuria.  Musculoskeletal:  Positive for joint pain.  Skin:  Negative for rash.  Neurological:  Negative for dizziness, speech change, seizures, loss of consciousness and headaches.  Psychiatric/Behavioral:  Negative for depression.       Objective:     BP 114/80 (BP Location: Left Arm, Patient Position: Sitting, Cuff Size: Normal)   Pulse 70   Temp 98.1 F (36.7 C) (Oral)   Ht 6' 6.35" (1.99 m)   Wt 297 lb 11.2 oz (135 kg)   SpO2 98%   BMI 34.10 kg/m    Physical Exam Constitutional:      General: He is not in acute distress.    Appearance: He is well-developed.  HENT:     Head: Normocephalic and atraumatic.     Right Ear: External ear normal.     Left Ear: External ear normal.  Eyes:     Conjunctiva/sclera: Conjunctivae normal.     Pupils: Pupils are equal, round, and reactive to light.  Neck:     Thyroid: No thyromegaly.  Cardiovascular:     Rate and Rhythm: Normal rate and regular rhythm.     Heart sounds: Normal heart sounds. No murmur heard. Pulmonary:     Effort: No respiratory distress.     Breath sounds: No wheezing or rales.  Abdominal:     General: Bowel sounds are normal. There is no distension.     Palpations: Abdomen  is soft. There is no mass.     Tenderness: There is no abdominal tenderness. There is no guarding or rebound.  Musculoskeletal:     Cervical back: Normal range of motion and neck supple.     Right lower leg: No edema.     Left lower leg: No edema.  Lymphadenopathy:     Cervical: No cervical adenopathy.  Skin:    Findings: No rash.  Neurological:     Mental Status: He is alert and oriented to person, place, and time.     Cranial Nerves: No cranial nerve deficit.  Psychiatric:        Mood and Affect: Mood normal.        Thought Content: Thought content normal.      No results found for any visits on 03/19/22.    The 10-year ASCVD risk  score (Arnett DK, et al., 2019) is: 4.7%    Assessment & Plan:   Problem List Items Addressed This Visit   None Visit Diagnoses     Physical exam    -  Primary   Relevant Orders   Basic metabolic panel   Lipid panel   CBC with Differential/Platelet   Hepatic function panel   TSH   PSA   Family history of hemochromatosis       Relevant Orders   Iron, TIBC and Ferritin Panel   Need for Tdap vaccination       Relevant Orders   Tdap vaccine greater than or equal to 7yo IM (Completed)     -Tdap given -Consider annual flu vaccine -Recommended colon cancer screening.  He had Cologuard at home but never completed.  He declines colonoscopy. -Obtain screening labs as above -Patient would like to get repeat hemochromatosis screening with positive family history in his father  No follow-ups on file.    Evelena Peat, MD

## 2022-03-20 ENCOUNTER — Other Ambulatory Visit: Payer: Self-pay

## 2022-03-20 DIAGNOSIS — Z8349 Family history of other endocrine, nutritional and metabolic diseases: Secondary | ICD-10-CM

## 2022-03-20 LAB — IRON,TIBC AND FERRITIN PANEL
%SAT: 39 % (calc) (ref 20–48)
Ferritin: 310 ng/mL (ref 38–380)
Iron: 119 ug/dL (ref 50–180)
TIBC: 306 mcg/dL (calc) (ref 250–425)

## 2022-06-26 ENCOUNTER — Other Ambulatory Visit: Payer: BC Managed Care – PPO

## 2022-10-28 ENCOUNTER — Ambulatory Visit: Payer: BC Managed Care – PPO | Admitting: Family Medicine

## 2022-10-28 ENCOUNTER — Encounter: Payer: Self-pay | Admitting: Family Medicine

## 2022-10-28 VITALS — BP 136/82 | HR 90 | Temp 97.7°F | Resp 16 | Ht 78.35 in | Wt 309.0 lb

## 2022-10-28 DIAGNOSIS — K0889 Other specified disorders of teeth and supporting structures: Secondary | ICD-10-CM

## 2022-10-28 DIAGNOSIS — K056 Periodontal disease, unspecified: Secondary | ICD-10-CM | POA: Diagnosis not present

## 2022-10-28 MED ORDER — TRAMADOL HCL 50 MG PO TABS: 50.0000 mg | ORAL_TABLET | Freq: Three times a day (TID) | ORAL | 0 refills | Status: AC | PRN

## 2022-10-28 MED ORDER — IBUPROFEN 600 MG PO TABS: 600.0000 mg | ORAL_TABLET | Freq: Three times a day (TID) | ORAL | 0 refills | Status: AC | PRN

## 2022-10-28 MED ORDER — AMOXICILLIN-POT CLAVULANATE 875-125 MG PO TABS: 1.0000 | ORAL_TABLET | Freq: Two times a day (BID) | ORAL | 0 refills | Status: AC

## 2022-10-28 NOTE — Patient Instructions (Signed)
A few things to remember from today's visit:  Toothache - Plan: traMADol (ULTRAM) 50 MG tablet, ibuprofen (ADVIL) 600 MG tablet  Periodontal disease - Plan: amoxicillin-clavulanate (AUGMENTIN) 875-125 MG tablet Tramadol causes drowsiness. Take Ibuprofen with food. Keep appt with your dentist.  If you need refills for medications you take chronically, please call your pharmacy. Do not use My Chart to request refills or for acute issues that need immediate attention. If you send a my chart message, it may take a few days to be addressed, specially if I am not in the office.  Please be sure medication list is accurate. If a new problem present, please set up appointment sooner than planned today.

## 2022-10-28 NOTE — Progress Notes (Signed)
ACUTE VISIT Chief Complaint  Patient presents with   Abscess    Broken tooth causing pain, can't see dentist until next week.    HPI: Mr.Tyler Fernandez is a 57 y.o. male with no significant past medical history here today complaining of tooth ache as described above. He reports experiencing generalized tooth pain following a respiratory virus last Saturday. Despite the resolution of his sinus symptoms, he continues to suffer from severe localized pain lower left gum and around a tooth, which is temporarily alleviated by applying cold compresses.  Dental Pain  This is a new problem. The current episode started in the past 7 days. The problem has been gradually worsening. The pain is at a severity of 9/10. The pain is severe. Pertinent negatives include no difficulty swallowing, facial pain or oral bleeding. He has tried ice and acetaminophen for the symptoms. The treatment provided mild relief.  He denies experiencing fever, chills, or difficulty swallowing.  Pain is exacerbated by chewing.  He suspects he may have had an abscess in the past. He has been managing the pain with aspirin and Tylenol. However, he notes that Tylenol has induced heart palpitations on two occasions.  Despite temporary relief, the pain consistently returns. He also mentions that the pain has extended to his jaw, though he reports no swelling in the neck or face area.  Review of Systems  Constitutional:  Positive for fatigue. Negative for activity change and appetite change.  HENT:  Negative for mouth sores and sore throat.   Eyes:  Negative for photophobia, redness and visual disturbance.  Respiratory:  Negative for cough, shortness of breath and wheezing.   Gastrointestinal:  Negative for abdominal pain, nausea and vomiting.  Skin:  Negative for rash.  Neurological:  Negative for numbness and headaches.  See other pertinent positives and negatives in HPI.  Current Outpatient Medications on File Prior to  Visit  Medication Sig Dispense Refill   mometasone (ELOCON) 0.1 % lotion Apply topically daily. 60 mL 1   triamcinolone cream (KENALOG) 0.5 % Apply 1 Application topically 2 (two) times daily. 15 g 0   No current facility-administered medications on file prior to visit.   Past Medical History:  Diagnosis Date   Shingles 1995   Allergies  Allergen Reactions   Aluminum-Containing Compounds Rash   Social History   Socioeconomic History   Marital status: Married    Spouse name: Not on file   Number of children: Not on file   Years of education: Not on file   Highest education level: Not on file  Occupational History   Not on file  Tobacco Use   Smoking status: Never   Smokeless tobacco: Never  Vaping Use   Vaping Use: Never used  Substance and Sexual Activity   Alcohol use: No   Drug use: No   Sexual activity: Not on file  Other Topics Concern   Not on file  Social History Narrative   Not on file   Social Determinants of Health   Financial Resource Strain: Not on file  Food Insecurity: Not on file  Transportation Needs: Not on file  Physical Activity: Not on file  Stress: Not on file  Social Connections: Not on file   Vitals:   10/28/22 0928  BP: 136/82  Pulse: 90  Resp: 16  Temp: 97.7 F (36.5 C)  SpO2: 100%   Body mass index is 35.39 kg/m.  Physical Exam Vitals and nursing note reviewed.  Constitutional:  General: He is not in acute distress.    Appearance: He is well-developed. He is not ill-appearing.  HENT:     Head: Normocephalic and atraumatic.     Right Ear: External ear normal.     Left Ear: External ear normal.     Mouth/Throat:     Mouth: Mucous membranes are moist.     Dentition: Abnormal dentition. Dental tenderness and gingival swelling present.   Eyes:     Conjunctiva/sclera: Conjunctivae normal.  Cardiovascular:     Rate and Rhythm: Normal rate and regular rhythm.     Heart sounds: No murmur heard. Pulmonary:     Effort:  Pulmonary effort is normal. No respiratory distress.     Breath sounds: Normal breath sounds. No stridor.  Musculoskeletal:     Cervical back: Neck supple. No edema or erythema.  Lymphadenopathy:     Head:     Right side of head: No submandibular adenopathy.     Left side of head: No submandibular adenopathy.     Cervical: No cervical adenopathy.  Skin:    General: Skin is warm.     Findings: No erythema or rash.  Neurological:     Mental Status: He is alert and oriented to person, place, and time.     Cranial Nerves: No cranial nerve deficit.     Gait: Gait normal.  Psychiatric:        Mood and Affect: Mood and affect normal.   ASSESSMENT AND PLAN:  Mr. Kerl was seen today for dental pain.  Toothache We discussed possible etiologies. For pain management recommend tramadol 50 mg 3 times daily and ibuprofen 600 mg 3 times daily with food. We discussed some side effects of medications. He has an appointment with his dentist next week.  -     traMADol HCl; Take 1 tablet (50 mg total) by mouth every 8 (eight) hours as needed for up to 3 days.  Dispense: 9 tablet; Refill: 0 -     Ibuprofen; Take 1 tablet (600 mg total) by mouth every 8 (eight) hours as needed for up to 5 days. With food.  Dispense: 15 tablet; Refill: 0  Periodontal disease No clear findings to suggest a dental abscess, he has had similar symptoms in the past and has been treated as such. Recommend Augmentin x 7 days. Pain management as described above. Instructed about warning signs. Continue a good dental hygiene. Strongly recommend keeping appointment with his dentist.  -     Amoxicillin-Pot Clavulanate; Take 1 tablet by mouth 2 (two) times daily for 7 days.  Dispense: 14 tablet; Refill: 0  Return if symptoms worsen or fail to improve.  Lexy Meininger G. [place], MD  [facility]. [place] office.

## 2022-11-02 IMAGING — DX DG SHOULDER 2+V*R*
3 series · 3 of 3 positions shown · non-contrast
Comparison: None.

CLINICAL DATA: Right shoulder pain. Pain for several months after
injury this [REDACTED].

EXAM:
RIGHT SHOULDER - 2+ VIEW

[shoulder ap (1 of 2)]
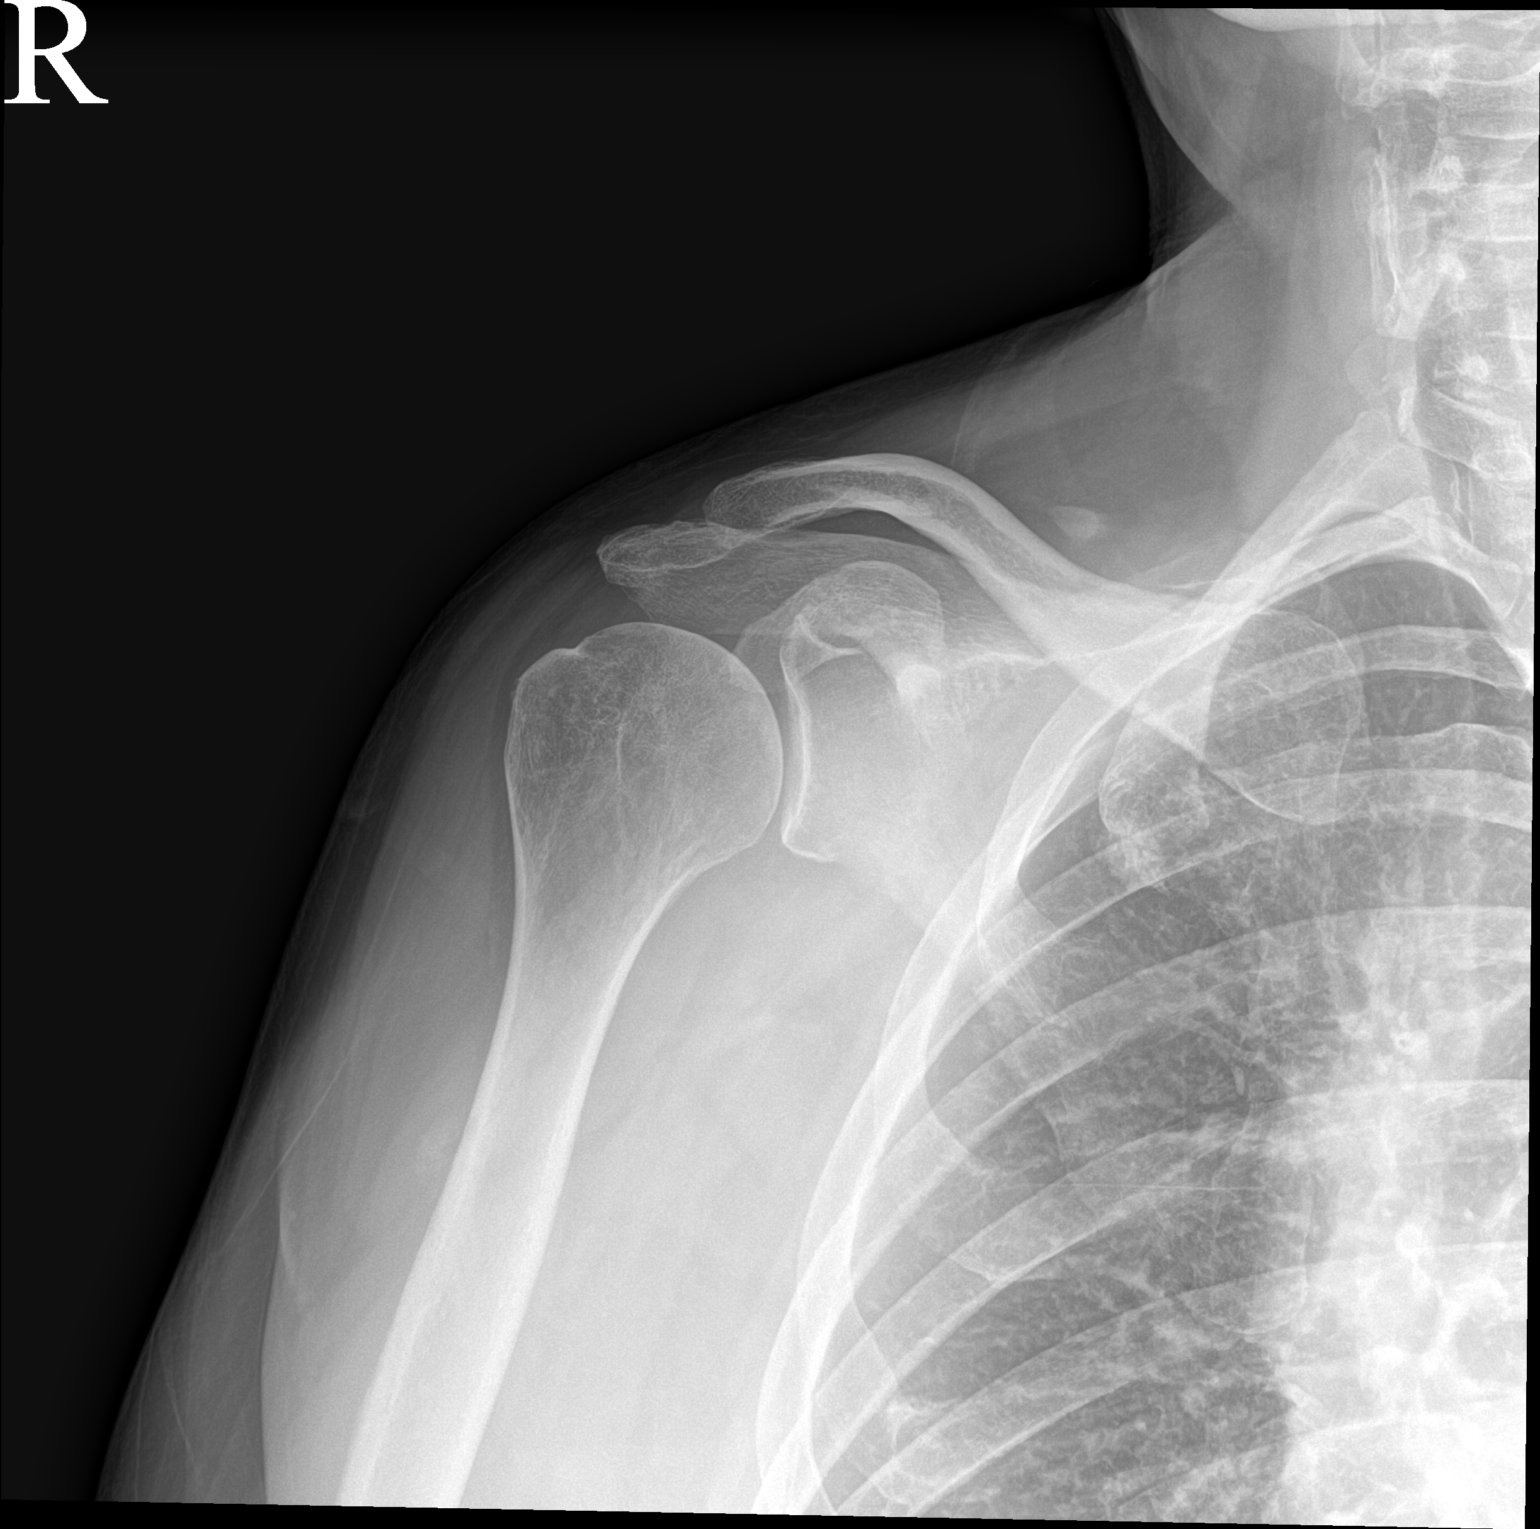

[shoulder ap (2 of 2)]
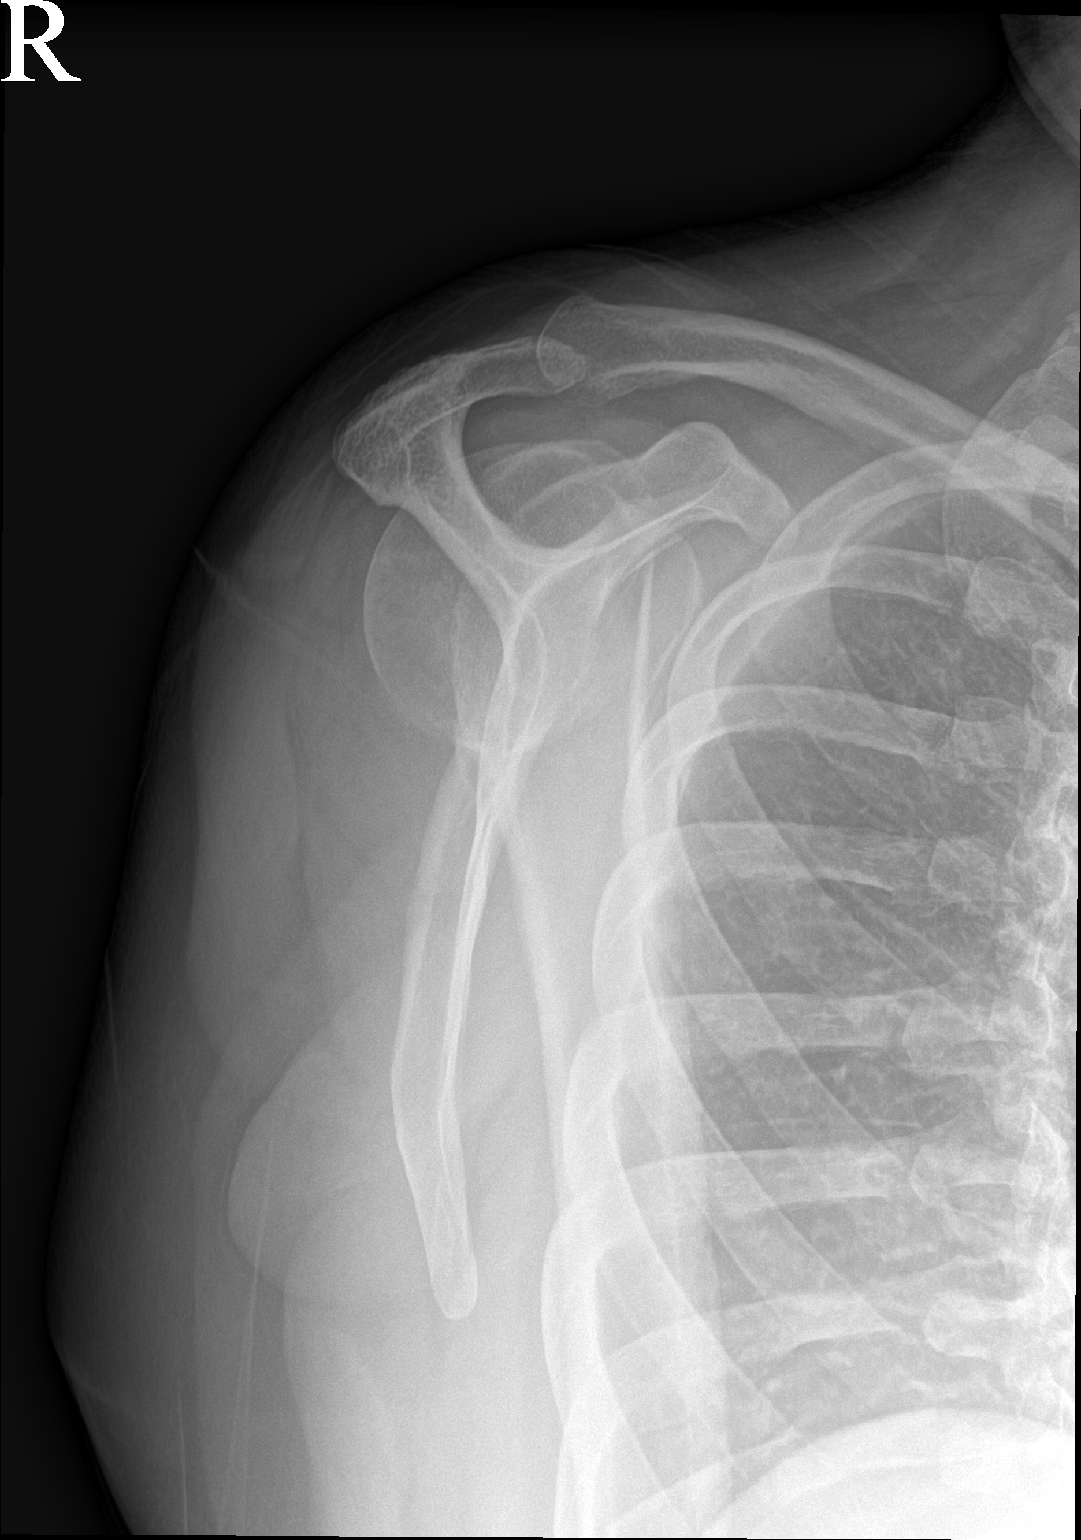

[shoulder axial]
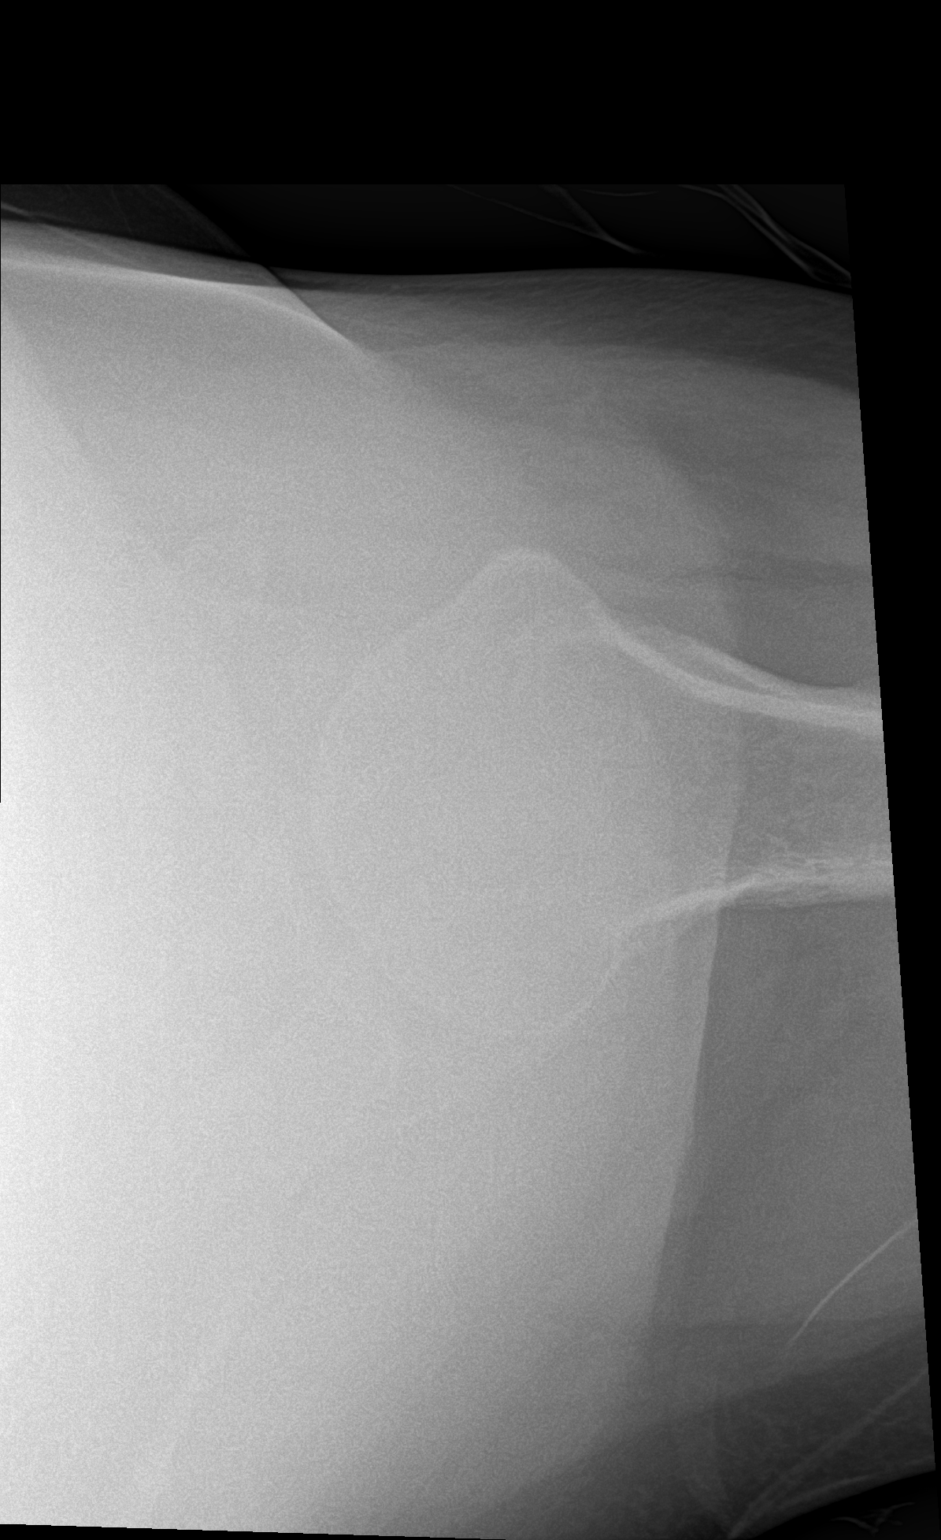

[3 of 3 positions shown; findings below may reference images not displayed]

FINDINGS: Axillary view is limited by soft tissue attenuation from habitus.
There is no evidence of fracture or dislocation. The joint spaces
are preserved. There is no evidence of arthropathy or other focal
bone abnormality. Soft tissues are unremarkable.
IMPRESSION: Unremarkable radiographic appearance of the right shoulder.

## 2022-12-24 NOTE — Progress Notes (Signed)
    Aleen Sells D.[facility] [address] [place] [address] Phone: [phone]   Assessment and Plan:     There are no diagnoses linked to this encounter.  ***   Pertinent previous records reviewed include ***   Follow Up: ***     Subjective:   I, Tyler Fernandez, am serving as a Neurosurgeon for Doctor Richardean Sale  Chief Complaint: left knee pain   HPI:   12/25/2022 Patient is a 57 year old male complaining of left knee pain. Patient states  Relevant Historical Information: ***  Additional pertinent review of systems negative.   Current Outpatient Medications:    mometasone (ELOCON) 0.1 % lotion, Apply topically daily., Disp: 60 mL, Rfl: 1   triamcinolone cream (KENALOG) 0.5 %, Apply 1 Application topically 2 (two) times daily., Disp: 15 g, Rfl: 0   Objective:     There were no vitals filed for this visit.    There is no height or weight on file to calculate BMI.    Physical Exam:    ***   Electronically signed by:  Aleen Sells [facility] 7:29 AM 12/24/22

## 2022-12-25 ENCOUNTER — Ambulatory Visit: Payer: BC Managed Care – PPO | Admitting: Sports Medicine

## 2022-12-25 VITALS — BP 118/80 | HR 78 | Ht 78.0 in | Wt 300.0 lb

## 2022-12-25 DIAGNOSIS — G8929 Other chronic pain: Secondary | ICD-10-CM

## 2022-12-25 DIAGNOSIS — M25562 Pain in left knee: Secondary | ICD-10-CM | POA: Diagnosis not present

## 2022-12-25 DIAGNOSIS — M25561 Pain in right knee: Secondary | ICD-10-CM | POA: Diagnosis not present

## 2022-12-25 MED ORDER — MELOXICAM 15 MG PO TABS: 15.0000 mg | ORAL_TABLET | Freq: Every day | ORAL | 0 refills | Status: DC

## 2022-12-25 NOTE — Patient Instructions (Addendum)
Knee HEP - Start meloxicam 15 mg daily x2 weeks.  If still having pain after 2 weeks, complete 3rd-week of meloxicam. May use remaining meloxicam as needed once daily for pain control.  Do not to use additional NSAIDs while taking meloxicam.  May use Tylenol [phone] mg 2 to 3 times a day for breakthrough pain. 3-4 week follow up

## 2023-01-21 ENCOUNTER — Other Ambulatory Visit: Payer: Self-pay | Admitting: Sports Medicine

## 2023-01-21 NOTE — Telephone Encounter (Signed)
Patient called requesting this medication.  He said that he was told to call when he needed a refill.

## 2023-01-26 ENCOUNTER — Other Ambulatory Visit: Payer: Self-pay | Admitting: Sports Medicine

## 2023-01-26 DIAGNOSIS — G8929 Other chronic pain: Secondary | ICD-10-CM

## 2023-01-26 MED ORDER — MELOXICAM 15 MG PO TABS: 15.0000 mg | ORAL_TABLET | Freq: Every day | ORAL | 0 refills | Status: DC | PRN

## 2023-01-26 NOTE — Progress Notes (Signed)
One-time meloxicam refill provided that patient may use as needed.  Recommend not using long-term more than 1-2 times per week.

## 2023-02-23 ENCOUNTER — Other Ambulatory Visit: Payer: Self-pay | Admitting: Sports Medicine

## 2023-02-23 DIAGNOSIS — G8929 Other chronic pain: Secondary | ICD-10-CM

## 2023-03-17 NOTE — Progress Notes (Unsigned)
    Aleen Sells D.[facility] [address] [place] [address] Phone: [phone]   Assessment and Plan:     There are no diagnoses linked to this encounter.  ***   Pertinent previous records reviewed include ***   Follow Up: ***     Subjective:   I, Ashlei Chinchilla, am serving as a Neurosurgeon for Doctor Richardean Sale   Chief Complaint: left knee pain    HPI:    12/25/2022 Patient is a 57 year old male complaining of left knee pain. 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   Current Outpatient Medications:    meloxicam (MOBIC) 15 MG tablet, Take 1 tablet (15 mg total) by mouth daily as needed for pain., Disp: 30 tablet, Rfl: 0   mometasone (ELOCON) 0.1 % lotion, Apply topically daily., Disp: 60 mL, Rfl: 1   triamcinolone cream (KENALOG) 0.5 %, Apply 1 Application topically 2 (two) times daily., Disp: 15 g, Rfl: 0   Objective:     There were no vitals filed for this visit.    There is no height or weight on file to calculate BMI.    Physical Exam:    ***   Electronically signed by:  Aleen Sells [facility] 12:34 PM 03/17/23

## 2023-03-18 ENCOUNTER — Ambulatory Visit (INDEPENDENT_AMBULATORY_CARE_PROVIDER_SITE_OTHER): Payer: BC Managed Care – PPO

## 2023-03-18 ENCOUNTER — Ambulatory Visit: Payer: BC Managed Care – PPO | Admitting: Sports Medicine

## 2023-03-18 VITALS — BP 126/82 | HR 76 | Ht 78.0 in | Wt 305.0 lb

## 2023-03-18 DIAGNOSIS — M25561 Pain in right knee: Secondary | ICD-10-CM

## 2023-03-18 DIAGNOSIS — M25562 Pain in left knee: Secondary | ICD-10-CM

## 2023-03-18 DIAGNOSIS — G8929 Other chronic pain: Secondary | ICD-10-CM | POA: Diagnosis not present

## 2023-03-18 DIAGNOSIS — M170 Bilateral primary osteoarthritis of knee: Secondary | ICD-10-CM

## 2023-03-18 NOTE — Patient Instructions (Signed)
Knee HEP  4 week follow up  Discontinue meloxicam  Use tylenol for day to day pain relief

## 2023-04-16 ENCOUNTER — Ambulatory Visit: Payer: BC Managed Care – PPO | Admitting: Sports Medicine

## 2024-06-15 ENCOUNTER — Encounter: Payer: Self-pay | Admitting: Family Medicine

## 2024-06-15 ENCOUNTER — Ambulatory Visit (INDEPENDENT_AMBULATORY_CARE_PROVIDER_SITE_OTHER): Payer: Self-pay | Admitting: Family Medicine

## 2024-06-15 VITALS — BP 118/74 | HR 72 | Temp 98.4°F | Ht 78.0 in | Wt 301.0 lb

## 2024-06-15 DIAGNOSIS — Z0000 Encounter for general adult medical examination without abnormal findings: Secondary | ICD-10-CM | POA: Diagnosis not present

## 2024-06-15 DIAGNOSIS — Z8349 Family history of other endocrine, nutritional and metabolic diseases: Secondary | ICD-10-CM | POA: Diagnosis not present

## 2024-06-15 DIAGNOSIS — Z1211 Encounter for screening for malignant neoplasm of colon: Secondary | ICD-10-CM

## 2024-06-15 DIAGNOSIS — R5383 Other fatigue: Secondary | ICD-10-CM

## 2024-06-15 LAB — CBC WITH DIFFERENTIAL/PLATELET
Basophils Absolute: 0 K/uL (ref 0.0–0.1)
Basophils Relative: 0.5 % (ref 0.0–3.0)
Eosinophils Absolute: 0.3 K/uL (ref 0.0–0.7)
Eosinophils Relative: 3.7 % (ref 0.0–5.0)
HCT: 47.7 % (ref 39.0–52.0)
Hemoglobin: 16.4 g/dL (ref 13.0–17.0)
Lymphocytes Relative: 29.5 % (ref 12.0–46.0)
Lymphs Abs: 2.4 K/uL (ref 0.7–4.0)
MCHC: 34.3 g/dL (ref 30.0–36.0)
MCV: 85.9 fl (ref 78.0–100.0)
Monocytes Absolute: 0.5 K/uL (ref 0.1–1.0)
Monocytes Relative: 6 % (ref 3.0–12.0)
Neutro Abs: 4.9 K/uL (ref 1.4–7.7)
Neutrophils Relative %: 60.3 % (ref 43.0–77.0)
Platelets: 249 K/uL (ref 150.0–400.0)
RBC: 5.55 Mil/uL (ref 4.22–5.81)
RDW: 13 % (ref 11.5–15.5)
WBC: 8.1 K/uL (ref 4.0–10.5)

## 2024-06-15 LAB — BASIC METABOLIC PANEL WITH GFR
BUN: 15 mg/dL (ref 6–23)
CO2: 31 meq/L (ref 19–32)
Calcium: 9.7 mg/dL (ref 8.4–10.5)
Chloride: 103 meq/L (ref 96–112)
Creatinine, Ser: 0.99 mg/dL (ref 0.40–1.50)
GFR: 84.95 mL/min (ref >60.00)
Glucose, Bld: 96 mg/dL (ref 70–99)
Potassium: 4.3 meq/L (ref 3.5–5.1)
Sodium: 141 meq/L (ref 135–145)

## 2024-06-15 LAB — HEPATIC FUNCTION PANEL
ALT: 24 U/L (ref 0–53)
AST: 19 U/L (ref 0–37)
Albumin: 4.7 g/dL (ref 3.5–5.2)
Alkaline Phosphatase: 86 U/L (ref 39–117)
Bilirubin, Direct: 0.2 mg/dL (ref 0.0–0.3)
Total Bilirubin: 1.1 mg/dL (ref 0.2–1.2)
Total Protein: 7 g/dL (ref 6.0–8.3)

## 2024-06-15 LAB — LIPID PANEL
Cholesterol: 203 mg/dL — ABNORMAL HIGH (ref 0–200)
HDL: 42.8 mg/dL (ref >39.00)
LDL Cholesterol: 138 mg/dL — ABNORMAL HIGH (ref 0–99)
NonHDL: 160.5
Total CHOL/HDL Ratio: 5
Triglycerides: 112 mg/dL (ref 0.0–149.0)
VLDL: 22.4 mg/dL (ref 0.0–40.0)

## 2024-06-15 NOTE — Patient Instructions (Signed)
 We are ordering cologuard.

## 2024-06-15 NOTE — Progress Notes (Signed)
 Established Patient Office Visit  Subjective   Patient ID: Tyler Fernandez, male    DOB: January 27, 1966  Age: 58 y.o. MRN: [id number]  Chief Complaint  Patient presents with   Annual Exam    HPI   [name] is seen today for physical exam.  Generally healthy.  He takes no regular medications.  Has had some recent issues with fatigue and low libido and requesting testosterone level.  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  Does have family history of hemochromatosis.  Previous transferrin saturation levels have been normal.  Health maintenance is reviewed.  He denies vaccines including influenza, pneumonia, Shingrix.  Tetanus is up-to-date. He had no history of colon cancer screening and has declined colonoscopy.  He does agree to [employer].  This had been ordered previously but he never completed it but states he will this time around.  Social history-married.  He has a son and a daughter.  He has never smoked.  Occasional alcohol use.  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  He does some part-time work with education officer, museum  Family history-and a brother that had reported MI at age 84.  Father had hemochromatosis but apparently died in his 32s of pancreatic cancer.  Mom has history of hypertension.  Past Medical History:  Diagnosis Date   Shingles 1995   Past Surgical History:  Procedure Laterality Date   CYST EXCISION  2005   benign lesion from back   KNEE ARTHROSCOPY Right 1991   MULTIPLE TOOTH EXTRACTIONS  06/2018    reports that he has never smoked. He has never used smokeless tobacco. He reports that he does not drink alcohol and does not use drugs. family history includes Aneurysm in his brother, father, and mother; Heart disease in his brother; Hemochromatosis in his father; Other in his father; Prostate cancer in his father; Pulmonary embolism in his  brother and father. Allergies  Allergen Reactions   Aluminum-Containing Compounds Rash   The 10-year ASCVD risk score (Arnett DK, et al., 2019) is: 7%   Values used to calculate the score:     Age: 47 years     Clincally relevant sex: Male     Is Non-Hispanic African American: No     Diabetic: No     Tobacco smoker: No     Systolic Blood Pressure: 118 mmHg     Is BP treated: No     HDL Cholesterol: 43.1 mg/dL     Total Cholesterol: 230 mg/dL  Review of Systems  Constitutional:  Negative for chills, fever, malaise/fatigue and weight loss.  HENT:  Negative for hearing loss.   Respiratory:  Negative for cough and shortness of breath.   Cardiovascular:  Negative for chest pain, palpitations and leg swelling.  Gastrointestinal:  Negative for abdominal pain, blood in stool, constipation and diarrhea.  Genitourinary:  Negative for dysuria.  Skin:  Negative for rash.  Neurological:  Negative for dizziness, speech change, seizures, loss of consciousness and headaches.  Psychiatric/Behavioral:  Negative for depression.       Objective:     BP 118/74   Pulse 72   Temp 98.4 F (36.9 C) (Oral)   Ht 6' 6 (1.981 m)   Wt (!) 301 lb (136.5 kg)   SpO2 96%   BMI 34.78 kg/m  BP Readings from Last 3 Encounters:  06/15/24 118/74  03/18/23 126/82  12/25/22 118/80  Wt Readings from Last 3 Encounters:  06/15/24 (!) 301 lb (136.5 kg)  03/18/23 (!) 305 lb (138.3 kg)  12/25/22 300 lb (136.1 kg)      Physical Exam Vitals reviewed.  Constitutional:      General: He is not in acute distress.    Appearance: He is well-developed. He is not ill-appearing.  HENT:     Head: Normocephalic and atraumatic.     Right Ear: External ear normal.     Left Ear: External ear normal.  Eyes:     Conjunctiva/sclera: Conjunctivae normal.     Pupils: Pupils are equal, round, and reactive to light.  Neck:     Thyroid: No thyromegaly.  Cardiovascular:     Rate and Rhythm: Normal rate and regular  rhythm.     Heart sounds: Normal heart sounds. No murmur heard. Pulmonary:     Effort: No respiratory distress.     Breath sounds: No wheezing or rales.  Abdominal:     General: Bowel sounds are normal. There is no distension.     Palpations: Abdomen is soft. There is no mass.     Tenderness: There is no abdominal tenderness. There is no guarding or rebound.  Musculoskeletal:     Cervical back: Normal range of motion and neck supple.     Right lower leg: No edema.     Left lower leg: No edema.  Lymphadenopathy:     Cervical: No cervical adenopathy.  Skin:    Findings: No rash.  Neurological:     Mental Status: He is alert and oriented to person, place, and time.     Cranial Nerves: No cranial nerve deficit.      No results found for any visits on 06/15/24.    The 10-year ASCVD risk score (Arnett DK, et al., 2019) is: 7%    Assessment & Plan:   Problem List Items Addressed This Visit   None Visit Diagnoses       Colon cancer screening    -  Primary   Relevant Orders   Cologuard     Physical exam       Relevant Orders   Basic metabolic panel with GFR   Lipid panel   CBC with Differential/Platelet   Hepatic function panel   PSA     Family history of hemochromatosis       Relevant Orders   Iron, TIBC and Ferritin Panel     Fatigue, unspecified type       Relevant Orders   Testosterone   TSH     57 year old male with history of hyperlipidemia but no active medical problems.  He has had some progressive fatigue recently.  Positive family history of premature CAD in brother at age 66 and also hemochromatosis in his father.  We discussed the following health maintenance issues  -Discussed vaccines including influenza, Shingrix, pneumonia and he declines all of these - Does agree to Cologuard screening.  He declines colonoscopy.  He is aware of limitations of Cologuard in terms of specificity and sensitivity. - Obtain labs as above.  Including testosterone and TSH  with his progressive fatigue issues - Repeat transferrin saturation labs with his family history of hemochromatosis - We did encourage him to consider possible coronary calcium  score to further risk stratify but he declines at this time.  He was given information to read about this.  No follow-ups on file.    Wolm Scarlet, MD

## 2024-06-16 ENCOUNTER — Ambulatory Visit: Payer: Self-pay | Admitting: Family Medicine

## 2024-06-16 DIAGNOSIS — R7989 Other specified abnormal findings of blood chemistry: Secondary | ICD-10-CM

## 2024-06-16 DIAGNOSIS — R790 Abnormal level of blood mineral: Secondary | ICD-10-CM

## 2024-06-16 LAB — TSH: TSH: 2.78 u[IU]/mL (ref 0.35–5.50)

## 2024-06-16 LAB — IRON,TIBC AND FERRITIN PANEL
%SAT: 46 % (ref 20–48)
Ferritin: 267 ng/mL (ref 38–380)
Iron: 127 ug/dL (ref 50–180)
TIBC: 275 ug/dL (ref 250–425)

## 2024-06-16 LAB — TESTOSTERONE: Testosterone: 253.65 ng/dL — ABNORMAL LOW (ref 300.00–890.00)

## 2024-06-16 LAB — PSA: PSA: 1.05 ng/mL (ref 0.10–4.00)

## 2024-07-19 ENCOUNTER — Other Ambulatory Visit

## 2024-07-19 ENCOUNTER — Ambulatory Visit: Payer: Self-pay | Admitting: Family Medicine

## 2024-07-19 DIAGNOSIS — R7989 Other specified abnormal findings of blood chemistry: Secondary | ICD-10-CM

## 2024-07-19 LAB — LUTEINIZING HORMONE: LH: 3.21 m[IU]/mL (ref 1.50–9.30)

## 2024-07-19 LAB — TESTOSTERONE: Testosterone: 153.5 ng/dL — ABNORMAL LOW (ref 300.00–890.00)

## 2024-07-19 LAB — FOLLICLE STIMULATING HORMONE: FSH: 6.1 m[IU]/mL (ref 1.4–18.1)

## 2024-07-20 MED ORDER — TESTOSTERONE 20.25 MG/ACT (1.62%) TD GEL: TRANSDERMAL | 3 refills | Status: AC

## 2024-07-20 NOTE — Telephone Encounter (Signed)
 Sent in prescription for topical AndroGel  pump spray 1 pump spray per arm once daily.  Make sure he has 32-month office follow-up.  Will recheck testosterone  level and CBC at that time  Wolm LELON Scarlet MD [facility]

## 2024-07-20 NOTE — Addendum Note (Signed)
 Addended by: METTA KRISTEN CROME on: 07/20/2024 01:10 PM   Modules accepted: Orders

## 2024-07-20 NOTE — Addendum Note (Signed)
 Addended by: MICHEAL WOLM ORN on: 07/20/2024 01:03 PM   Modules accepted: Orders

## 2024-07-21 ENCOUNTER — Other Ambulatory Visit (HOSPITAL_COMMUNITY): Payer: Self-pay

## 2024-07-21 ENCOUNTER — Telehealth: Payer: Self-pay

## 2024-07-21 NOTE — Telephone Encounter (Signed)
 Pharmacy Patient Advocate Encounter   Received notification from Onbase that prior authorization for Testosterone  1.62 % gel pump is required/requested.   Insurance verification completed.   The patient is insured through [facility].   Per test claim: PA required; PA submitted to above mentioned insurance via Latent Key/confirmation #/EOC [id number] Status is pending

## 2024-07-21 NOTE — Telephone Encounter (Signed)
 Pharmacy Patient Advocate Encounter  Received notification from [facility] that Prior Authorization for Testosterone  1.62% gel pump has been APPROVED from 07/21/24 to 07/22/27. Ran test claim, Copay is $16.00. This test claim was processed through [facility]- copay amounts may vary at other pharmacies due to pharmacy/plan contracts, or as the patient moves through the different stages of their insurance plan.   PA #/Case ID/Reference #: # [id number]

## 2024-09-20 ENCOUNTER — Ambulatory Visit: Admitting: Sports Medicine
